# Patient Record
Sex: Male | Born: 1950 | Race: White | Hispanic: No | Marital: Married | Smoking: Never smoker
Health system: Southern US, Community
[De-identification: ages and names within clinical notes are randomized; demographics above are authoritative.]

## PROBLEM LIST (undated history)

## (undated) DIAGNOSIS — M779 Enthesopathy, unspecified: Secondary | ICD-10-CM

## (undated) DIAGNOSIS — C4491 Basal cell carcinoma of skin, unspecified: Secondary | ICD-10-CM

## (undated) DIAGNOSIS — F32A Depression, unspecified: Secondary | ICD-10-CM

## (undated) DIAGNOSIS — I493 Ventricular premature depolarization: Secondary | ICD-10-CM

## (undated) DIAGNOSIS — I10 Essential (primary) hypertension: Secondary | ICD-10-CM

## (undated) DIAGNOSIS — R011 Cardiac murmur, unspecified: Secondary | ICD-10-CM

## (undated) DIAGNOSIS — F329 Major depressive disorder, single episode, unspecified: Secondary | ICD-10-CM

## (undated) DIAGNOSIS — D649 Anemia, unspecified: Secondary | ICD-10-CM

## (undated) DIAGNOSIS — T7840XA Allergy, unspecified, initial encounter: Secondary | ICD-10-CM

## (undated) DIAGNOSIS — C801 Malignant (primary) neoplasm, unspecified: Secondary | ICD-10-CM

## (undated) HISTORY — DX: Depression, unspecified: F32.A

## (undated) HISTORY — DX: Ventricular premature depolarization: I49.3

## (undated) HISTORY — DX: Allergy, unspecified, initial encounter: T78.40XA

## (undated) HISTORY — DX: Malignant (primary) neoplasm, unspecified: C80.1

## (undated) HISTORY — DX: Enthesopathy, unspecified: M77.9

## (undated) HISTORY — PX: TONSILLECTOMY AND ADENOIDECTOMY: SHX28

## (undated) HISTORY — PX: CYSTOSCOPY: SUR368

## (undated) HISTORY — DX: Anemia, unspecified: D64.9

## (undated) HISTORY — DX: Major depressive disorder, single episode, unspecified: F32.9

## (undated) HISTORY — PX: EYE SURGERY: SHX253

## (undated) HISTORY — PX: HERNIA REPAIR: SHX51

## (undated) HISTORY — DX: Cardiac murmur, unspecified: R01.1

## (undated) HISTORY — DX: Basal cell carcinoma of skin, unspecified: C44.91

## (undated) HISTORY — DX: Essential (primary) hypertension: I10

---

## 1997-07-02 ENCOUNTER — Ambulatory Visit (HOSPITAL_COMMUNITY): Admission: RE | Admit: 1997-07-02 | Discharge: 1997-07-02 | Payer: Self-pay | Admitting: Family Medicine

## 1998-10-26 ENCOUNTER — Ambulatory Visit (HOSPITAL_COMMUNITY): Admission: RE | Admit: 1998-10-26 | Discharge: 1998-10-26 | Payer: Self-pay | Admitting: Gastroenterology

## 1998-12-10 ENCOUNTER — Encounter: Payer: Self-pay | Admitting: *Deleted

## 1998-12-10 ENCOUNTER — Encounter: Admission: RE | Admit: 1998-12-10 | Discharge: 1998-12-10 | Payer: Self-pay | Admitting: *Deleted

## 1998-12-17 ENCOUNTER — Encounter: Payer: Self-pay | Admitting: *Deleted

## 1998-12-21 ENCOUNTER — Encounter: Payer: Self-pay | Admitting: *Deleted

## 1998-12-21 ENCOUNTER — Ambulatory Visit (HOSPITAL_COMMUNITY): Admission: RE | Admit: 1998-12-21 | Discharge: 1998-12-21 | Payer: Self-pay | Admitting: *Deleted

## 2003-09-04 ENCOUNTER — Ambulatory Visit (HOSPITAL_COMMUNITY): Admission: RE | Admit: 2003-09-04 | Discharge: 2003-09-04 | Payer: Self-pay | Admitting: General Surgery

## 2010-10-13 ENCOUNTER — Ambulatory Visit (INDEPENDENT_AMBULATORY_CARE_PROVIDER_SITE_OTHER): Payer: Medicare HMO | Admitting: Ophthalmology

## 2010-10-13 DIAGNOSIS — H43819 Vitreous degeneration, unspecified eye: Secondary | ICD-10-CM

## 2010-10-13 DIAGNOSIS — H33309 Unspecified retinal break, unspecified eye: Secondary | ICD-10-CM

## 2010-10-13 DIAGNOSIS — H2510 Age-related nuclear cataract, unspecified eye: Secondary | ICD-10-CM

## 2010-10-31 ENCOUNTER — Ambulatory Visit (INDEPENDENT_AMBULATORY_CARE_PROVIDER_SITE_OTHER): Payer: Self-pay | Admitting: Ophthalmology

## 2011-10-13 ENCOUNTER — Encounter (INDEPENDENT_AMBULATORY_CARE_PROVIDER_SITE_OTHER): Payer: BC Managed Care – PPO | Admitting: Ophthalmology

## 2011-10-13 DIAGNOSIS — H43819 Vitreous degeneration, unspecified eye: Secondary | ICD-10-CM

## 2011-10-13 DIAGNOSIS — H3530 Unspecified macular degeneration: Secondary | ICD-10-CM

## 2011-10-13 DIAGNOSIS — H33309 Unspecified retinal break, unspecified eye: Secondary | ICD-10-CM

## 2011-10-13 DIAGNOSIS — H2510 Age-related nuclear cataract, unspecified eye: Secondary | ICD-10-CM

## 2011-11-03 ENCOUNTER — Encounter: Payer: Self-pay | Admitting: Family Medicine

## 2011-11-03 ENCOUNTER — Ambulatory Visit (INDEPENDENT_AMBULATORY_CARE_PROVIDER_SITE_OTHER): Payer: BC Managed Care – PPO | Admitting: Family Medicine

## 2011-11-03 VITALS — BP 138/88 | HR 62 | Temp 98.7°F | Resp 16 | Ht 72.0 in | Wt 243.0 lb

## 2011-11-03 DIAGNOSIS — R4586 Emotional lability: Secondary | ICD-10-CM

## 2011-11-03 DIAGNOSIS — R42 Dizziness and giddiness: Secondary | ICD-10-CM

## 2011-11-03 DIAGNOSIS — H9313 Tinnitus, bilateral: Secondary | ICD-10-CM

## 2011-11-03 DIAGNOSIS — H9190 Unspecified hearing loss, unspecified ear: Secondary | ICD-10-CM | POA: Insufficient documentation

## 2011-11-03 DIAGNOSIS — Z23 Encounter for immunization: Secondary | ICD-10-CM

## 2011-11-03 DIAGNOSIS — Z0000 Encounter for general adult medical examination without abnormal findings: Secondary | ICD-10-CM

## 2011-11-03 LAB — COMPREHENSIVE METABOLIC PANEL
ALT: 9 U/L (ref 0–53)
AST: 21 U/L (ref 0–37)
Albumin: 4.4 g/dL (ref 3.5–5.2)
Alkaline Phosphatase: 57 U/L (ref 39–117)
BUN: 16 mg/dL (ref 6–23)
CO2: 27 mEq/L (ref 19–32)
Calcium: 9.3 mg/dL (ref 8.4–10.5)
Chloride: 104 mEq/L (ref 96–112)
Creat: 0.84 mg/dL (ref 0.50–1.35)
Glucose, Bld: 91 mg/dL (ref 70–99)
Potassium: 4.2 mEq/L (ref 3.5–5.3)
Sodium: 140 mEq/L (ref 135–145)
Total Bilirubin: 1.5 mg/dL — ABNORMAL HIGH (ref 0.3–1.2)
Total Protein: 6.8 g/dL (ref 6.0–8.3)

## 2011-11-03 LAB — CBC WITH DIFFERENTIAL/PLATELET
Basophils Absolute: 0 10*3/uL (ref 0.0–0.1)
Basophils Relative: 0 % (ref 0–1)
Eosinophils Absolute: 0.1 10*3/uL (ref 0.0–0.7)
Eosinophils Relative: 2 % (ref 0–5)
HCT: 46.7 % (ref 39.0–52.0)
Hemoglobin: 16.7 g/dL (ref 13.0–17.0)
Lymphocytes Relative: 21 % (ref 12–46)
Lymphs Abs: 1.6 10*3/uL (ref 0.7–4.0)
MCH: 31.3 pg (ref 26.0–34.0)
MCHC: 35.8 g/dL (ref 30.0–36.0)
MCV: 87.5 fL (ref 78.0–100.0)
Monocytes Absolute: 0.6 10*3/uL (ref 0.1–1.0)
Monocytes Relative: 7 % (ref 3–12)
Neutro Abs: 5.3 10*3/uL (ref 1.7–7.7)
Neutrophils Relative %: 70 % (ref 43–77)
Platelets: 260 10*3/uL (ref 150–400)
RBC: 5.34 MIL/uL (ref 4.22–5.81)
RDW: 13.8 % (ref 11.5–15.5)
WBC: 7.6 10*3/uL (ref 4.0–10.5)

## 2011-11-03 LAB — LIPID PANEL
Cholesterol: 203 mg/dL — ABNORMAL HIGH (ref 0–200)
HDL: 53 mg/dL (ref >39)
LDL Cholesterol: 130 mg/dL — ABNORMAL HIGH (ref 0–99)
Total CHOL/HDL Ratio: 3.8 Ratio
Triglycerides: 99 mg/dL (ref <150)
VLDL: 20 mg/dL (ref 0–40)

## 2011-11-03 LAB — TSH: TSH: 1.65 u[IU]/mL (ref 0.350–4.500)

## 2011-11-03 LAB — HEPATITIS C ANTIBODY: HCV Ab: NEGATIVE

## 2011-11-03 LAB — PSA: PSA: 4.98 ng/mL — ABNORMAL HIGH (ref <=4.00)

## 2011-11-03 MED ORDER — HYDROCHLOROTHIAZIDE 12.5 MG PO TABS: 12.5000 mg | ORAL_TABLET | Freq: Every day | ORAL | Status: DC

## 2011-11-03 MED ORDER — BUPROPION HCL ER (XL) 150 MG PO TB24: 150.0000 mg | ORAL_TABLET | Freq: Every day | ORAL | Status: DC

## 2011-11-03 MED ORDER — FLUTICASONE PROPIONATE 50 MCG/ACT NA SUSP
2.0000 | Freq: Every day | NASAL | Status: DC
Start: 2011-11-03 — End: 2012-04-10

## 2011-11-03 NOTE — Progress Notes (Signed)
  Subjective:    Patient ID: Donald Norman, male    DOB: 26-Dec-1950, 61 y.o.   MRN: [id number]  HPI    Review of Systems  Constitutional: Positive for fatigue.  HENT: Positive for congestion and tinnitus.   Eyes: Negative.   Respiratory: Negative.   Cardiovascular: Negative.   Gastrointestinal: Negative.   Genitourinary: Negative.   Musculoskeletal: Negative.   Neurological: Positive for dizziness.  Hematological: Negative.   Psychiatric/Behavioral: Positive for decreased concentration.       Objective:   Physical Exam        Assessment & Plan:

## 2011-11-03 NOTE — Progress Notes (Signed)
Subjective:    Patient ID: Donald Norman, male    DOB: [birth year], 61 y.o.   MRN: [id number] Chief Complaint  Patient presents with  . Annual Exam    HPI Feeling faint a lot Decreased equilibrium Used zoloft prev but now noticing more trouble concentrating and getting down mood - now mother having health problems.  Past Medical History  Diagnosis Date  . Allergy   . Depression   . Heart murmur   . Hypertension   . Cancer   . Basal cell cancer   . Anemia   . PVC (premature ventricular contraction)    No current outpatient prescriptions on file prior to visit.   No current facility-administered medications on file prior to visit.   No Known Allergies Past Surgical History  Procedure Laterality Date  . Eye surgery  lasik and retina repair  . Tonsillectomy and adenoidectomy    . Cystoscopy    . Hernia repair     Family History  Problem Relation Age of Onset  . Heart disease Mother   . Hypertension Mother   . Diabetes Father   . Heart disease Father   . Hypertension Sister   . Anemia Sister   . Heart disease Maternal Grandmother   . Diabetes Paternal Grandmother    History   Social History  . Marital Status: Married    Spouse Name: N/A    Number of Children: N/A  . Years of Education: N/A   Social History Main Topics  . Smoking status: Never Smoker   . Smokeless tobacco: None  . Alcohol Use: Yes  . Drug Use: No  . Sexually Active: None   Other Topics Concern  . None   Social History Narrative  . None     Review of Systems  see note below for ROS    BP 138/88  Pulse 62  Temp(Src) 98.7 F (37.1 C) (Oral)  Resp 16  Ht 6' (1.829 m)  Wt 243 lb (110.224 kg)  BMI 32.95 kg/m2  SpO2 96% Objective:   Physical Exam  Constitutional: He is oriented to person, place, and time. He appears well-developed and well-nourished. No distress.  HENT:  Head: Normocephalic and atraumatic.  Right Ear: Tympanic membrane, external ear and ear canal normal.  Left  Ear: Tympanic membrane, external ear and ear canal normal.  Nose: Nose normal.  Mouth/Throat: Uvula is midline, oropharynx is clear and moist and mucous membranes are normal. No oropharyngeal exudate.  Eyes: Conjunctivae are normal. Right eye exhibits no discharge. Left eye exhibits no discharge. No scleral icterus.  Neck: Normal range of motion. Neck supple. No thyromegaly present.  Cardiovascular: Normal rate, regular rhythm, normal heart sounds and intact distal pulses.   Pulmonary/Chest: Effort normal and breath sounds normal. No respiratory distress.  Abdominal: Soft. Bowel sounds are normal. He exhibits no distension and no mass. There is no tenderness. There is no rebound and no guarding.  Musculoskeletal: He exhibits no edema.  Lymphadenopathy:    He has no cervical adenopathy.  Neurological: He is alert and oriented to person, place, and time. He has normal reflexes. No cranial nerve deficit. He exhibits normal muscle tone.  Skin: Skin is warm and dry. No rash noted. He is not diaphoretic. No erythema.  Psychiatric: He has a normal mood and affect. His behavior is normal.          Assessment & Plan:  Routine general medical examination at a health care facility - Plan: Lipid panel, PSA,  Tdap vaccine greater than or equal to 7yo IM, Hepatitis C antibody  Loss of equilibrium - Plan: CBC with Differential, Comprehensive metabolic panel, TSH  Tinnitus of both ears - Plan: try fluticasone (FLONASE) 50 MCG/ACT nasal spray  Elevated blood pressure - Plan: start hydrochlorothiazide (HYDRODIURIL) 12.5 MG tablet  Mood change - Plan: restart buPROPion (WELLBUTRIN XL) 150 MG 24 hr tablet  Hearing decreased - f/u w/ ENT  Meds ordered this encounter  Medications  . aspirin 81 MG tablet    Sig: Take 81 mg by mouth daily.  Marland Kitchen ibuprofen (ADVIL,MOTRIN) 200 MG tablet    Sig: Take 200 mg by mouth every 6 (six) hours as needed.  Marland Kitchen DISCONTD: etodolac (LODINE) 400 MG tablet    Sig:   .  hydrochlorothiazide (HYDRODIURIL) 12.5 MG tablet    Sig: Take 1 tablet (12.5 mg total) by mouth daily.    Dispense:  90 tablet    Refill:  1  . buPROPion (WELLBUTRIN XL) 150 MG 24 hr tablet    Sig: Take 1 tablet (150 mg total) by mouth daily.    Dispense:  90 tablet    Refill:  1  . fluticasone (FLONASE) 50 MCG/ACT nasal spray    Sig: Place 2 sprays into the nose daily.    Dispense:  16 g    Refill:  6

## 2012-01-10 ENCOUNTER — Encounter (INDEPENDENT_AMBULATORY_CARE_PROVIDER_SITE_OTHER): Payer: BC Managed Care – PPO | Admitting: Ophthalmology

## 2012-01-10 DIAGNOSIS — H33309 Unspecified retinal break, unspecified eye: Secondary | ICD-10-CM

## 2012-01-10 DIAGNOSIS — H43819 Vitreous degeneration, unspecified eye: Secondary | ICD-10-CM

## 2012-01-10 DIAGNOSIS — H4310 Vitreous hemorrhage, unspecified eye: Secondary | ICD-10-CM

## 2012-01-10 DIAGNOSIS — H2510 Age-related nuclear cataract, unspecified eye: Secondary | ICD-10-CM

## 2012-01-24 ENCOUNTER — Ambulatory Visit (INDEPENDENT_AMBULATORY_CARE_PROVIDER_SITE_OTHER): Payer: BC Managed Care – PPO | Admitting: Ophthalmology

## 2012-01-24 DIAGNOSIS — H33309 Unspecified retinal break, unspecified eye: Secondary | ICD-10-CM

## 2012-03-11 ENCOUNTER — Telehealth: Payer: Self-pay

## 2012-03-11 NOTE — Telephone Encounter (Signed)
Pt is needing to talk with someone about being referred to an ent office   Best number [phone]

## 2012-03-11 NOTE — Telephone Encounter (Signed)
Order put in for ENT Dr Clelia Croft advised.

## 2012-03-19 ENCOUNTER — Ambulatory Visit (INDEPENDENT_AMBULATORY_CARE_PROVIDER_SITE_OTHER): Payer: BC Managed Care – PPO | Admitting: Family Medicine

## 2012-03-19 VITALS — BP 148/98 | HR 72 | Temp 98.5°F | Resp 18 | Ht 73.0 in | Wt 253.0 lb

## 2012-03-19 DIAGNOSIS — R635 Abnormal weight gain: Secondary | ICD-10-CM

## 2012-03-19 DIAGNOSIS — R5383 Other fatigue: Secondary | ICD-10-CM

## 2012-03-19 DIAGNOSIS — R5381 Other malaise: Secondary | ICD-10-CM

## 2012-03-19 LAB — POCT URINALYSIS DIPSTICK
Glucose, UA: NEGATIVE
Ketones, UA: NEGATIVE
Spec Grav, UA: 1.02
Urobilinogen, UA: 0.2

## 2012-03-19 LAB — COMPREHENSIVE METABOLIC PANEL
ALT: 10 U/L (ref 0–53)
Albumin: 4.5 g/dL (ref 3.5–5.2)
Alkaline Phosphatase: 51 U/L (ref 39–117)
Glucose, Bld: 82 mg/dL (ref 70–99)
Potassium: 4.3 mEq/L (ref 3.5–5.3)
Sodium: 139 mEq/L (ref 135–145)
Total Protein: 6.9 g/dL (ref 6.0–8.3)

## 2012-03-19 LAB — POCT UA - MICROSCOPIC ONLY
Casts, Ur, LPF, POC: NEGATIVE
Crystals, Ur, HPF, POC: NEGATIVE
Yeast, UA: NEGATIVE

## 2012-03-19 LAB — POCT CBC
Granulocyte percent: 65 %G (ref 37–80)
Hemoglobin: 16.8 g/dL (ref 14.1–18.1)
MCH, POC: 31 pg (ref 27–31.2)
MPV: 8.9 fL (ref 0–99.8)
POC Granulocyte: 4.2 (ref 2–6.9)
POC MID %: 7.5 %M (ref 0–12)
RBC: 5.42 M/uL (ref 4.69–6.13)
WBC: 6.4 10*3/uL (ref 4.6–10.2)

## 2012-03-19 LAB — TSH: TSH: 2.703 u[IU]/mL (ref 0.350–4.500)

## 2012-03-19 NOTE — Progress Notes (Addendum)
[facility] [address] [place] [address] [phone]  Date:  03/19/2012   Name:  Donald Norman.   DOB:  January 17, 1950   MRN:  [id number]  PCP:  No primary provider on file.    Chief Complaint: Dizziness   History of Present Illness:  Donald Norman. is a 62 y.o. very pleasant male patient who presents with the following:  Was here in November of 2013 with complaint of feeling faint.  He had fairly extensive labs at that time.    He states he has general weakness, "I feel like I'm going to black out."  This has gone on for about 10 days.  However, overall he has felt bad for about one month with fatigue and cold symptoms.   He saw ENT last week- he was worried about dysequilibrium.  His evaluation was ok except for mild hearing loss. He also has tinnitus which is long- standing.    He has sinus congestion, feels that his ears are "wet" on the inside, and notes some PND which causes an occasional cough.   No fever- he has checked his temperature several times.   No syncope, no vertigo.    No diet changes or acute stressors that he can think of.   He actually feels worse when he sits for a long time.    He did see his urologist for probably prostatitis in November and was treated, his PSA improved.  Otherwise he had extensive labs in [place] which were normal.    He stopped taking his HCTZ a few days ago to see if it was the cause of his symptoms-  however this did not seem to help.   He had started this in November due to slightly elevated BP and possible Menieres- when he first started HCTZ he felt faint but then got better  No heart palpitations, no CP.  He notes that he gets tired more easily if he walks up a flight of stairs.   He had exercised regularly until recently.  He has gained about 15 lbs over the last several months, due to eating poorly and not exercising for the last month or so.   No recent travel, no hemoptysis.  No calf swelling or  pain He had noted headaches- however he stopped his flonase about 2 weeks ago and this went away.    He was told he had a heart murmur as a child, but this turned out to be benign.  He did have a stress test about 10 years ago- this looked ok except for PVCs  Patient Active Problem List  Diagnosis  . Mood change  . Hearing decreased  . Elevated blood pressure    Past Medical History  Diagnosis Date  . Allergy   . Depression   . Heart murmur   . Hypertension   . Cancer   . Basal cell cancer     Past Surgical History  Procedure Laterality Date  . Eye surgery  lasik and retina repair  . Tonsillectomy and adenoidectomy    . Cystoscopy      History  Substance Use Topics  . Smoking status: Never Smoker   . Smokeless tobacco: Not on file  . Alcohol Use: Yes    Family History  Problem Relation Age of Onset  . Heart disease Mother   . Hypertension Mother   . Diabetes Father   . Heart disease Father   . Hypertension Sister   .  Anemia Sister   . Heart disease Maternal Grandmother   . Diabetes Paternal Grandmother     No Known Allergies  Medication list has been reviewed and updated.  Current Outpatient Prescriptions on File Prior to Visit  Medication Sig Dispense Refill  . aspirin 81 MG tablet Take 81 mg by mouth daily.      Marland Kitchen etodolac (LODINE) 400 MG tablet       . hydrochlorothiazide (HYDRODIURIL) 12.5 MG tablet Take 1 tablet (12.5 mg total) by mouth daily.  90 tablet  1  . ibuprofen (ADVIL,MOTRIN) 200 MG tablet Take 200 mg by mouth every 6 (six) hours as needed.      Marland Kitchen buPROPion (WELLBUTRIN XL) 150 MG 24 hr tablet Take 1 tablet (150 mg total) by mouth daily.  90 tablet  1  . fluticasone (FLONASE) 50 MCG/ACT nasal spray Place 2 sprays into the nose daily.  16 g  6   No current facility-administered medications on file prior to visit.    Review of Systems:  As per HPI- otherwise negative.   Physical Examination: Filed Vitals:   03/19/12 1014  BP: 150/90   Pulse: 85  Temp: 98.5 F (36.9 C)  Resp: 18   Filed Vitals:   03/19/12 1014  Height: 6\' 1"  (1.854 m)  Weight: 253 lb (114.76 kg)   Body mass index is 33.39 kg/(m^2). Ideal Body Weight: Weight in (lb) to have BMI = 25: 189.1  GEN: WDWN, NAD, Non-toxic, A & O x 3, obese, looks well HEENT: Atraumatic, Normocephalic. Neck supple. No masses, No LAD.  Bilateral TM wnl, oropharynx normal.  PEERL,EOMI.   Ears and Nose: No external deformity. CV: RRR, No M/G/R. No JVD. No thrill. No extra heart sounds. PULM: CTA B, no wheezes, crackles, rhonchi. No retractions. No resp. distress. No accessory muscle use. ABD: S, NT, ND, +BS. No rebound. No HSM. EXTR: No c/c/e NEURO Normal gait. Normal strength and movement all extremities PSYCH: Normally interactive. Conversant. Not depressed or anxious appearing.  Calm demeanor.   Orthostatics performed- negative  EKG:  SR with a few PVCs.  He has a small Q wave in I which does not appear significant  Results for orders placed in visit on 03/19/12  POCT UA - MICROSCOPIC ONLY      Result Value Range   WBC, Ur, HPF, POC 1-2     RBC, urine, microscopic 2-4     Bacteria, U Microscopic trace     Mucus, UA neg     Epithelial cells, urine per micros 1-3     Crystals, Ur, HPF, POC neg     Casts, Ur, LPF, POC neg     Yeast, UA neg    POCT URINALYSIS DIPSTICK      Result Value Range   Color, UA yellow     Clarity, UA clear     Glucose, UA neg     Bilirubin, UA neg     Ketones, UA neg     Spec Grav, UA 1.020     Blood, UA mod     pH, UA 6.0     Protein, UA neg     Urobilinogen, UA 0.2     Nitrite, UA neg     Leukocytes, UA Negative    GLUCOSE, POCT (MANUAL RESULT ENTRY)      Result Value Range   POC Glucose 73  70 - 99 mg/dl  POCT CBC      Result Value Range   WBC 6.4  4.6 - 10.2 K/uL   Lymph, poc 1.8  0.6 - 3.4   POC LYMPH PERCENT 27.5  10 - 50 %L   MID (cbc) 0.5  0 - 0.9   POC MID % 7.5  0 - 12 %M   POC Granulocyte 4.2  2 - 6.9    Granulocyte percent 65.0  37 - 80 %G   RBC 5.42  4.69 - 6.13 M/uL   Hemoglobin 16.8  14.1 - 18.1 g/dL   HCT, POC 40.9  81.1 - 53.7 %   MCV 91.7  80 - 97 fL   MCH, POC 31.0  27 - 31.2 pg   MCHC 33.8  31.8 - 35.4 g/dL   RDW, POC 91.4     Platelet Count, POC 295  142 - 424 K/uL   MPV 8.9  0 - 99.8 fL    Assessment and Plan: Fatigue - Plan: TSH, POCT UA - Microscopic Only, POCT urinalysis dipstick, EKG 12-Lead, Comprehensive metabolic panel, POCT glucose (manual entry), POCT CBC, Urine culture, Ambulatory referral to Cardiology, CANCELED: CBC, CANCELED: POCT CBC  Weight gain - Plan: TSH  Microhematuria - Plan: Urine culture  Non- specific fatigue for 10 days or more.  Will refer to cardiology for evaluation- he may need a stress test.  Otherwise await further lab results.    Will have his electrolytes/ renal function back tomorrow. In the meantime his BP is a bit high, so he may start back on his low dose HCTZ.  If any changes or worsening in the meantime he is to let us know right away or seek care at the ED if necessary  Microhematuria- await urine culture. His urologist is Dr. Irene Pap.  He has never been a smoker.     Signed Abbe Amsterdam, MD  03/20/12- called to let him know his TSH and CMP were normal- await urine culture and will send letter once this is back.  He is feeling better today, but still plan to proceed with cardiology evaluation  03/21/12- called with negative urine culture.  See phone note from 3/20:Called to let him know- his urine culture was negative. However, he noted some burning after urination this am, and a mild "ache" in his bladder. He has been diagnosed with interstitial cystitis in the past.  He states he used to use keflex and cipro for this condition  He was last on cipro for prostatits about 2 months ago- used for about one month. He would like to try taking keflex, and this is reasonable. Will rx a week of keflex for him, and send his most recent labs  to his urologist. He will let me know if he has not heard about his cardiology appt in the next day or so- will send a message to referrals    03/22/12- called his urologist and asked his nurse Herbert Seta to look out for records, will fax to Dr. Irene Pap  Will send a copy of his labs and notes.

## 2012-03-19 NOTE — Patient Instructions (Addendum)
Go ahead and restart your HCTZ.  I will be in touch with the rest of your labs in the next day or so.  I am going to refer you to cardiology for evaluation.  If you have any problems in the meantime please let me know/ come back in or seek care at the ER if necessary

## 2012-03-20 LAB — URINE CULTURE
Colony Count: NO GROWTH
Organism ID, Bacteria: NO GROWTH

## 2012-03-21 ENCOUNTER — Encounter: Payer: Self-pay | Admitting: Family Medicine

## 2012-03-21 ENCOUNTER — Telehealth: Payer: Self-pay

## 2012-03-21 DIAGNOSIS — R300 Dysuria: Secondary | ICD-10-CM

## 2012-03-21 MED ORDER — CEPHALEXIN 500 MG PO CAPS: 500.0000 mg | ORAL_CAPSULE | Freq: Two times a day (BID) | ORAL | Status: DC

## 2012-03-21 NOTE — Telephone Encounter (Signed)
Patient saw Dr. Patsy Lager  He has pain in Bladder, urethra, and thinks he has a UTI.   He is waiting for the urine results for his OV.  He had cystitis in the past.  [phone] on file      HE SAID it was [phone]

## 2012-03-21 NOTE — Telephone Encounter (Signed)
Urine culture shows no growth, please advise.

## 2012-03-21 NOTE — Telephone Encounter (Signed)
Called to let him know- his urine culture was negative.  However, he noted some burning after urination this am, and a mild "ache" in his bladder.  He has been diagnosed with interstitial cystitis in the past.   He states he used to use keflex and cipro for this condition  He was last on cipro for prostatits about 2 months ago- used for about one month.  He would like to try taking keflex, and this is reasonable. Will rx a week of keflex for him, and send his most recent labs to his urologist.  He will let me know if he has not heard about his cardiology appt in the next day or so- will send a message to referrals

## 2012-03-28 ENCOUNTER — Telehealth: Payer: Self-pay

## 2012-03-28 ENCOUNTER — Telehealth: Payer: Self-pay | Admitting: Family Medicine

## 2012-03-28 NOTE — Telephone Encounter (Signed)
Called and LMOM_ asked him to please call with an update as to if the keflex helped with his symptoms. Also, it looks like his cardiology appt is scheduled for 4/9- this is great, but if he still feels weak or strange please come and see me for a recheck in the meantime, as this is still a couple of weeks away.

## 2012-03-28 NOTE — Telephone Encounter (Signed)
Pt is returning dr copland call to let her know how he is feeling and he states he thought he was getting better but today he feels tired again  Patient is planning on coming in to see dr copland next week if no better

## 2012-03-28 NOTE — Telephone Encounter (Signed)
Called patient. Advised to rest. Fatigue is expected during recovery period. This should improve gradually. Donald Norman

## 2012-03-28 NOTE — Telephone Encounter (Signed)
Called and spoke with him- yesterday he felt great, but today he just feels tired, but no other particular symptoms noted.  No orthopnea, no SOB, no CP.  I do feel that a cardiac evaluation is pertinent, but want to make sure we are not missing anything else. Asked him to come and see me tomorrow if he continues to feel bad tomorrow and he agreed

## 2012-03-28 NOTE — Telephone Encounter (Signed)
Called and confirmed that Donald Norman's records have been received at [facility].

## 2012-03-29 ENCOUNTER — Ambulatory Visit: Payer: BC Managed Care – PPO

## 2012-03-29 ENCOUNTER — Ambulatory Visit (INDEPENDENT_AMBULATORY_CARE_PROVIDER_SITE_OTHER): Payer: BC Managed Care – PPO | Admitting: Family Medicine

## 2012-03-29 VITALS — BP 145/90 | HR 66 | Temp 98.4°F | Resp 16 | Ht 72.0 in | Wt 252.6 lb

## 2012-03-29 DIAGNOSIS — R5381 Other malaise: Secondary | ICD-10-CM

## 2012-03-29 DIAGNOSIS — R5383 Other fatigue: Secondary | ICD-10-CM

## 2012-03-29 DIAGNOSIS — R635 Abnormal weight gain: Secondary | ICD-10-CM

## 2012-03-29 NOTE — Progress Notes (Signed)
[facility] [address], [place] [address] [phone]  Date:  03/29/2012   Name:  Donald Norman.   DOB:  1950-10-28   MRN:  [id number]  PCP:  No primary provider on file.    Chief Complaint: Follow-up   History of Present Illness:  Donald Norman. is a 62 y.o. very pleasant male patient who presents with the following:  Seen here on 3/18 with a complaint of fatigue and pre- syncope.  He is here today for a recheck.  We had talked on the phone yesterday and he reported that he had felt better, then worse again, so he came in for evaluation.  He does have a cardiology appt on 04/10/12.   In the morning he feels tired, "just washed out."  Will feel better during the day when he is active, but worse again if he sits for a long period.  He does not note orthopnea.  No peripheral edema.   He also notes a cyst on his left arm.  No other new findings   He has not noted any CP.  He does not have any SOB.   Occasional cough, no hemoptysis.   He has never been a smoker.    Patient Active Problem List  Diagnosis  . Mood change  . Hearing decreased  . Elevated blood pressure    Past Medical History  Diagnosis Date  . Allergy   . Depression   . Heart murmur   . Hypertension   . Cancer   . Basal cell cancer   . Anemia   . PVC (premature ventricular contraction)     Past Surgical History  Procedure Laterality Date  . Eye surgery  lasik and retina repair  . Tonsillectomy and adenoidectomy    . Cystoscopy    . Hernia repair      History  Substance Use Topics  . Smoking status: Never Smoker   . Smokeless tobacco: Not on file  . Alcohol Use: Yes    Family History  Problem Relation Age of Onset  . Heart disease Mother   . Hypertension Mother   . Diabetes Father   . Heart disease Father   . Hypertension Sister   . Anemia Sister   . Heart disease Maternal Grandmother   . Diabetes Paternal Grandmother     No Known Allergies  Medication list  has been reviewed and updated.  Current Outpatient Prescriptions on File Prior to Visit  Medication Sig Dispense Refill  . aspirin 81 MG tablet Take 81 mg by mouth daily.      . cephALEXin (KEFLEX) 500 MG capsule Take 1 capsule (500 mg total) by mouth 2 (two) times daily.  20 capsule  0  . hydrochlorothiazide (HYDRODIURIL) 12.5 MG tablet Take 1 tablet (12.5 mg total) by mouth daily.  90 tablet  1  . ibuprofen (ADVIL,MOTRIN) 200 MG tablet Take 200 mg by mouth every 6 (six) hours as needed.      Marland Kitchen buPROPion (WELLBUTRIN XL) 150 MG 24 hr tablet Take 1 tablet (150 mg total) by mouth daily.  90 tablet  1  . etodolac (LODINE) 400 MG tablet       . fluticasone (FLONASE) 50 MCG/ACT nasal spray Place 2 sprays into the nose daily.  16 g  6   No current facility-administered medications on file prior to visit.    Review of Systems:  As per HPI- otherwise negative.   Physical Examination: Filed Vitals:  03/29/12 1230  BP: 144/104  Pulse: 88  Temp: 98.4 F (36.9 C)  Resp: 16   Filed Vitals:   03/29/12 1230  Height: 6' (1.829 m)  Weight: 252 lb 9.6 oz (114.579 kg)   Body mass index is 34.25 kg/(m^2). Ideal Body Weight: Weight in (lb) to have BMI = 25: 183.9  GEN: WDWN, NAD, Non-toxic, A & O x 3, overweight HEENT: Atraumatic, Normocephalic. Neck supple. No masses, No LAD.  Bilateral TM wnl, oropharynx normal.  PEERL,EOMI.   Ears and Nose: No external deformity. CV: RRR, No M/G/R. No JVD. No thrill. No extra heart sounds. PULM: CTA B, no wheezes, crackles, rhonchi. No retractions. No resp. distress. No accessory muscle use. ABD: S, NT, ND EXTR: No c/c/e NEURO Normal gait.  PSYCH: Normally interactive. Conversant. Not depressed or anxious appearing.  Calm demeanor.   EKG: compared to EKG from earlier this month, no significant change.  Normal EKG.    [facility] reading (PRIMARY) by  Dr. Patsy Lager. Normal, no cardiac enlargement CHEST - 2 VIEW  Comparison: None.  Findings:  Cardiomediastinal silhouette appears normal. No acute pulmonary disease is noted. Bony thorax is intact.  IMPRESSION: No acute cardiopulmonary abnormality seen.   Assessment and Plan: Other malaise and fatigue - Plan: EKG 12-Lead, DG Chest 2 View, Testosterone, D-dimer, quantitative  Weight gain - Plan: Brain natriuretic peptide, Testosterone  Canceled D dimer as he has normal EKG, normal CP, normal VS.   [name] notes persistent fatigue for about one month.  Will check a BNP and testosterone as above.  Assuming these are normal, believe he will be safe to await his cardiology appt.  If he develops any CP or other symptoms in the meantime he is to let me know.   Signed Abbe Amsterdam, MD

## 2012-03-30 LAB — BRAIN NATRIURETIC PEPTIDE: Brain Natriuretic Peptide: 12.1 pg/mL (ref 0.0–100.0)

## 2012-03-31 ENCOUNTER — Telehealth: Payer: Self-pay | Admitting: Family Medicine

## 2012-03-31 NOTE — Telephone Encounter (Signed)
Called and discussed with him. His BNP is normal, no sign of CHF.  Testosterone is low- this may be why he is feeling fatigued.  He is interested in starting T replacement therapy- however explained that we need to be sure his heart is healthy prior to starting testosterone.  He will see cards on 4/9, and we will follow- up after this visit.  Of note he has had a borderline PSA in the past, will need to discuss T therapy in conjunction with his urologist.

## 2012-04-08 ENCOUNTER — Encounter: Payer: Self-pay | Admitting: Cardiology

## 2012-04-08 ENCOUNTER — Encounter: Payer: Self-pay | Admitting: *Deleted

## 2012-04-08 DIAGNOSIS — T7840XA Allergy, unspecified, initial encounter: Secondary | ICD-10-CM | POA: Insufficient documentation

## 2012-04-08 DIAGNOSIS — R011 Cardiac murmur, unspecified: Secondary | ICD-10-CM | POA: Insufficient documentation

## 2012-04-08 DIAGNOSIS — D649 Anemia, unspecified: Secondary | ICD-10-CM | POA: Insufficient documentation

## 2012-04-08 DIAGNOSIS — I493 Ventricular premature depolarization: Secondary | ICD-10-CM | POA: Insufficient documentation

## 2012-04-08 DIAGNOSIS — C801 Malignant (primary) neoplasm, unspecified: Secondary | ICD-10-CM | POA: Insufficient documentation

## 2012-04-08 DIAGNOSIS — I10 Essential (primary) hypertension: Secondary | ICD-10-CM | POA: Insufficient documentation

## 2012-04-08 DIAGNOSIS — F329 Major depressive disorder, single episode, unspecified: Secondary | ICD-10-CM | POA: Insufficient documentation

## 2012-04-10 ENCOUNTER — Ambulatory Visit (INDEPENDENT_AMBULATORY_CARE_PROVIDER_SITE_OTHER): Payer: BC Managed Care – PPO | Admitting: Cardiovascular Disease

## 2012-04-10 VITALS — BP 144/91 | HR 74 | Ht 73.0 in | Wt 251.0 lb

## 2012-04-10 DIAGNOSIS — R5381 Other malaise: Secondary | ICD-10-CM

## 2012-04-10 DIAGNOSIS — I493 Ventricular premature depolarization: Secondary | ICD-10-CM

## 2012-04-10 DIAGNOSIS — R5383 Other fatigue: Secondary | ICD-10-CM

## 2012-04-10 DIAGNOSIS — I10 Essential (primary) hypertension: Secondary | ICD-10-CM

## 2012-04-10 DIAGNOSIS — I4949 Other premature depolarization: Secondary | ICD-10-CM

## 2012-04-10 NOTE — Assessment & Plan Note (Signed)
Benign asymptomatic  Given mulitple infections and fatigue will order echo to r/o SBE and make sure EF is normal

## 2012-04-10 NOTE — Progress Notes (Signed)
Patient ID: Donald Hinch., male   DOB: 09/28/1950, 62 y.o.   MRN: [id number] 62 yo with fatigue since November.  No previios heart problems.  Has had tinnitis, interstitial cystitis and URI's all winter and been on antibiotics. Sometimes feels better after getting up and doing some activity.  Has had mild headache. No edema, palpitations chest pain or cardiac symptoms No syncope.  TSH 2.7 on 3/18  Do not see that he has been tested for addisons or myesthenia   ROS: Denies fever, malais, weight loss, blurry vision, decreased visual acuity, cough, sputum, SOB, hemoptysis, pleuritic pain, palpitaitons, heartburn, abdominal pain, melena, lower extremity edema, claudication, or rash.  All other systems reviewed and negative   General: Affect appropriate Healthy:  appears stated age HEENT: normal Neck supple with no adenopathy JVP normal no bruits no thyromegaly Lungs clear with no wheezing and good diaphragmatic motion Heart:  S1/S2 no murmur,rub, gallop or click PMI normal Abdomen: benighn, BS positve, no tenderness, no AAA no bruit.  No HSM or HJR Distal pulses intact with no bruits No edema Neuro non-focal Skin warm and dry No muscular weakness  Medications Current Outpatient Prescriptions  Medication Sig Dispense Refill  . aspirin 81 MG tablet Take 81 mg by mouth daily.      . hydrochlorothiazide (HYDRODIURIL) 12.5 MG tablet Take 1 tablet (12.5 mg total) by mouth daily.  90 tablet  1  . ibuprofen (ADVIL,MOTRIN) 200 MG tablet Take 200 mg by mouth every 6 (six) hours as needed.      . Misc Natural Products (PROSTATE SUPPORT PO) Take by mouth.      . Multiple Vitamin (MULTIVITAMIN WITH MINERALS) TABS Take 1 tablet by mouth daily.      . Potassium Gluconate 550 MG TABS Take by mouth.       No current facility-administered medications for this visit.    Allergies Review of patient's allergies indicates no known allergies.  Family History: Family History  Problem Relation Age of  Onset  . Heart disease Mother   . Hypertension Mother   . Diabetes Father   . Heart disease Father   . Hypertension Sister   . Anemia Sister   . Heart disease Maternal Grandmother   . Diabetes Paternal Grandmother     Social History: History   Social History  . Marital Status: Married    Spouse Name: N/A    Number of Children: N/A  . Years of Education: N/A   Occupational History  . Not on file.   Social History Main Topics  . Smoking status: Never Smoker   . Smokeless tobacco: Not on file  . Alcohol Use: Yes  . Drug Use: No  . Sexually Active: Not on file   Other Topics Concern  . Not on file   Social History Narrative  . No narrative on file    Electrocardiogram:  NSR rate 78  Normal ECG  Assessment and Plan

## 2012-04-10 NOTE — Assessment & Plan Note (Signed)
Well controlled.  Continue current medications and low sodium Dash type diet.    

## 2012-04-10 NOTE — Patient Instructions (Signed)

## 2012-04-11 ENCOUNTER — Ambulatory Visit (HOSPITAL_COMMUNITY): Payer: BC Managed Care – PPO | Attending: Cardiovascular Disease | Admitting: Radiology

## 2012-04-11 DIAGNOSIS — I10 Essential (primary) hypertension: Secondary | ICD-10-CM | POA: Insufficient documentation

## 2012-04-11 DIAGNOSIS — R5381 Other malaise: Secondary | ICD-10-CM | POA: Insufficient documentation

## 2012-04-11 DIAGNOSIS — R011 Cardiac murmur, unspecified: Secondary | ICD-10-CM | POA: Insufficient documentation

## 2012-04-11 DIAGNOSIS — R5383 Other fatigue: Secondary | ICD-10-CM

## 2012-04-11 DIAGNOSIS — E669 Obesity, unspecified: Secondary | ICD-10-CM | POA: Insufficient documentation

## 2012-04-11 DIAGNOSIS — I4949 Other premature depolarization: Secondary | ICD-10-CM | POA: Insufficient documentation

## 2012-04-11 NOTE — Progress Notes (Signed)
Echocardiogram performed.  

## 2012-04-15 ENCOUNTER — Telehealth: Payer: Self-pay | Admitting: Cardiovascular Disease

## 2012-04-15 NOTE — Telephone Encounter (Signed)
New problem    Returning call back to nurse.   

## 2012-04-15 NOTE — Telephone Encounter (Signed)
PT AWARE OF ECHO RESULTS./CY 

## 2012-04-17 ENCOUNTER — Telehealth: Payer: Self-pay

## 2012-04-17 NOTE — Telephone Encounter (Signed)
PATIENT STATES THAT HE HAD A CARDIOLOGY REFERRAL AND THE RESULTS HAVE COME BACK. HE WANTS TO KNOW THE NEXT STEPS. PLEASE CALL AT [phone].

## 2012-04-18 NOTE — Telephone Encounter (Signed)
Dr Eden Emms has advised patient of echo results, but do not see plan, have you gotten correspondence regarding this?

## 2012-04-18 NOTE — Telephone Encounter (Signed)
Reviewed his echo- looks like he has some diastolic dysfunction but this may not be significant.  Called [place] but could not get through.  Will send epic message to Dr. Eden Emms, and will follow- up with Renae Fickle once I hear back.

## 2012-04-22 ENCOUNTER — Telehealth: Payer: Self-pay | Admitting: Family Medicine

## 2012-04-22 ENCOUNTER — Encounter: Payer: Self-pay | Admitting: Family Medicine

## 2012-04-22 NOTE — Telephone Encounter (Signed)
Message copied by Pearline Cables on Mon Apr 22, 2012 11:55 AM ------      Message from: Wendall Stade      Created: Sun Apr 21, 2012  2:42 PM       Ok to Rx low T no further cardiac w.u needed      ----- Message -----         From: Pearline Cables, MD         Sent: 04/18/2012   1:30 PM           To: Wendall Stade, MD            Dear Dr. Eden Emms,            Thanks for seeing my patient Lavone Nian.  He called me to ask if anything else was needed from a cardiac standpoint.  Per my read his echo showed diastolic dysfunction, but I am not sure if this is significant.  If no further cardiac evaluation is needed I plan to try treating his low testosterone in hopes of relieving his fatigue.              Warm regards,            Jessica Copland       ------

## 2012-04-22 NOTE — Telephone Encounter (Signed)
Ok to do testosterone replacement.  However, noted pt has seen Dr. Vernie Ammons in the past for elevated PSA.  Called his urologist to discuss his care.   Spoke with his nurse and passed along information regarding his testosterone level. ?ok to start T replacement (had an elevated PSA in the past but this apparently resolved).

## 2012-04-24 ENCOUNTER — Telehealth: Payer: Self-pay | Admitting: Family Medicine

## 2012-04-24 NOTE — Telephone Encounter (Signed)
Discussed with his wife.  He saw his urologist today.  They are following up and plan to see him again next week

## 2012-05-29 ENCOUNTER — Ambulatory Visit (INDEPENDENT_AMBULATORY_CARE_PROVIDER_SITE_OTHER): Payer: BC Managed Care – PPO | Admitting: Ophthalmology

## 2012-06-05 ENCOUNTER — Other Ambulatory Visit: Payer: Self-pay | Admitting: Family Medicine

## 2012-06-11 ENCOUNTER — Ambulatory Visit (INDEPENDENT_AMBULATORY_CARE_PROVIDER_SITE_OTHER): Payer: BC Managed Care – PPO | Admitting: Ophthalmology

## 2012-06-11 DIAGNOSIS — H43819 Vitreous degeneration, unspecified eye: Secondary | ICD-10-CM

## 2012-06-11 DIAGNOSIS — I10 Essential (primary) hypertension: Secondary | ICD-10-CM

## 2012-06-11 DIAGNOSIS — H35039 Hypertensive retinopathy, unspecified eye: Secondary | ICD-10-CM

## 2012-06-11 DIAGNOSIS — H2510 Age-related nuclear cataract, unspecified eye: Secondary | ICD-10-CM

## 2012-06-11 DIAGNOSIS — H33309 Unspecified retinal break, unspecified eye: Secondary | ICD-10-CM

## 2012-07-13 ENCOUNTER — Ambulatory Visit: Payer: BC Managed Care – PPO

## 2012-07-13 ENCOUNTER — Ambulatory Visit (INDEPENDENT_AMBULATORY_CARE_PROVIDER_SITE_OTHER): Payer: BC Managed Care – PPO | Admitting: Family Medicine

## 2012-07-13 DIAGNOSIS — S82892A Other fracture of left lower leg, initial encounter for closed fracture: Secondary | ICD-10-CM

## 2012-07-13 DIAGNOSIS — M25562 Pain in left knee: Secondary | ICD-10-CM

## 2012-07-13 DIAGNOSIS — M25569 Pain in unspecified knee: Secondary | ICD-10-CM

## 2012-07-13 DIAGNOSIS — R079 Chest pain, unspecified: Secondary | ICD-10-CM
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
## 2012-07-16 ENCOUNTER — Encounter: Payer: Self-pay | Admitting: *Deleted

## 2012-08-02 DIAGNOSIS — Z0271 Encounter for disability determination: Secondary | ICD-10-CM

## 2012-10-14 ENCOUNTER — Ambulatory Visit (INDEPENDENT_AMBULATORY_CARE_PROVIDER_SITE_OTHER): Payer: BC Managed Care – PPO | Admitting: Ophthalmology

## 2013-06-11 ENCOUNTER — Ambulatory Visit (INDEPENDENT_AMBULATORY_CARE_PROVIDER_SITE_OTHER): Payer: BC Managed Care – PPO | Admitting: Ophthalmology

## 2013-06-11 DIAGNOSIS — H33309 Unspecified retinal break, unspecified eye: Secondary | ICD-10-CM

## 2013-06-11 DIAGNOSIS — H2510 Age-related nuclear cataract, unspecified eye: Secondary | ICD-10-CM

## 2013-06-11 DIAGNOSIS — H43819 Vitreous degeneration, unspecified eye: Secondary | ICD-10-CM

## 2013-06-11 DIAGNOSIS — I10 Essential (primary) hypertension: Secondary | ICD-10-CM

## 2013-06-11 DIAGNOSIS — H35039 Hypertensive retinopathy, unspecified eye: Secondary | ICD-10-CM

## 2013-06-11 DIAGNOSIS — H3530 Unspecified macular degeneration: Secondary | ICD-10-CM

## 2013-11-21 ENCOUNTER — Encounter: Payer: Self-pay | Admitting: Family Medicine

## 2013-11-21 ENCOUNTER — Ambulatory Visit (INDEPENDENT_AMBULATORY_CARE_PROVIDER_SITE_OTHER): Payer: BC Managed Care – PPO | Admitting: Family Medicine

## 2013-11-21 VITALS — BP 156/102 | HR 66 | Temp 98.5°F | Resp 16 | Ht 72.0 in | Wt 220.0 lb

## 2013-11-21 DIAGNOSIS — R7989 Other specified abnormal findings of blood chemistry: Secondary | ICD-10-CM

## 2013-11-21 DIAGNOSIS — E7800 Pure hypercholesterolemia, unspecified: Secondary | ICD-10-CM

## 2013-11-21 DIAGNOSIS — E291 Testicular hypofunction: Secondary | ICD-10-CM

## 2013-11-21 DIAGNOSIS — Z0000 Encounter for general adult medical examination without abnormal findings: Secondary | ICD-10-CM

## 2013-11-21 DIAGNOSIS — I10 Essential (primary) hypertension: Secondary | ICD-10-CM

## 2013-11-21 DIAGNOSIS — H9319 Tinnitus, unspecified ear: Secondary | ICD-10-CM

## 2013-11-21 DIAGNOSIS — H6980 Other specified disorders of Eustachian tube, unspecified ear: Secondary | ICD-10-CM

## 2013-11-21 LAB — CBC
HEMATOCRIT: 52.4 % — AB (ref 39.0–52.0)
Hemoglobin: 18.2 g/dL — ABNORMAL HIGH (ref 13.0–17.0)
MCH: 31.2 pg (ref 26.0–34.0)
MCHC: 34.7 g/dL (ref 30.0–36.0)
MCV: 89.9 fL (ref 78.0–100.0)
MPV: 9.9 fL (ref 9.4–12.4)
PLATELETS: 218 10*3/uL (ref 150–400)
RBC: 5.83 MIL/uL — ABNORMAL HIGH (ref 4.22–5.81)
RDW: 13.6 % (ref 11.5–15.5)
WBC: 5.7 10*3/uL (ref 4.0–10.5)

## 2013-11-21 LAB — COMPLETE METABOLIC PANEL WITH GFR
ALK PHOS: 48 U/L (ref 39–117)
ALT: 8 U/L (ref 0–53)
AST: 21 U/L (ref 0–37)
Albumin: 4.4 g/dL (ref 3.5–5.2)
BILIRUBIN TOTAL: 1.9 mg/dL — AB (ref 0.2–1.2)
BUN: 17 mg/dL (ref 6–23)
CO2: 27 mEq/L (ref 19–32)
Calcium: 9.3 mg/dL (ref 8.4–10.5)
Chloride: 99 mEq/L (ref 96–112)
Creat: 0.84 mg/dL (ref 0.50–1.35)
GFR, Est African American: >89 mL/min
GLUCOSE: 90 mg/dL (ref 70–99)
Potassium: 4.2 mEq/L (ref 3.5–5.3)
SODIUM: 137 meq/L (ref 135–145)
TOTAL PROTEIN: 6.7 g/dL (ref 6.0–8.3)

## 2013-11-21 LAB — LIPID PANEL
CHOL/HDL RATIO: 3.5 ratio
CHOLESTEROL: 195 mg/dL (ref 0–200)
HDL: 55 mg/dL (ref >39)
LDL Cholesterol: 125 mg/dL — ABNORMAL HIGH (ref 0–99)
Triglycerides: 76 mg/dL (ref <150)
VLDL: 15 mg/dL (ref 0–40)

## 2013-11-21 MED ORDER — LOSARTAN POTASSIUM 50 MG PO TABS: 50.0000 mg | ORAL_TABLET | Freq: Every day | ORAL | Status: DC

## 2013-11-21 NOTE — Progress Notes (Signed)
MRN: [id number]  Subjective:   [name] is a 63 y.o. male presenting for annual physical exam.  Medical care team includes:  PCP: Donald Kroner, MD, last visit 07/2013 Vision: Dr. Tempie Norman, last seen 07/2013, signs of macular degeneration, follow up next year. Dental: Dr. Evelene Norman, last seen 08/2013, no new significant findings. Specialists:   Colonoscopy: Dr. Daisey Norman, no significant findings, last seen 10 years ago, patient recently called and reminded of need for f/u.  Urology: Dr. Karsten Norman, low testosterone  No Cardiologist.  [name] has Mood change; Hearing decreased; Elevated blood pressure; Depression; Heart murmur; Hypertension; and PVC (premature ventricular contraction) on his problem list.  Concerns: Tinnitus - previously (~4 years ago) referred to Dr. Simeon Norman (ENT) for hearing loss, vertigo and tinnitus, Rx'ed antivert which helped with dizziness but ringing remains. Hearing loss is unchanged, not worsened. Tinnitus is intermittent, not positional. Denies itchy, watery eyes, congestion, vertigo, dizziness, allergies, fevers.  HTN: on 25mg  losartan, non-compliant with medications, started them back up 3-4 days ago since he knew he was coming in for a visit. Diet has been bad, "eating like a pig", not exercising. Not smoking, drinking 1-2 beers a week. ROS below.  Immunizations: Flu vaccine 10/2013, last tetanus 11/03/2011, Shingles 2014    Prior to Admission medications   Medication Sig Start Date End Date Taking? Authorizing Provider  ANDROGEL PUMP 20.25 MG/ACT (1.62%) GEL  10/29/13  Yes Historical Provider, MD  losartan (COZAAR) 25 MG tablet Take 25 mg by mouth daily.   Yes Historical Provider, MD  sertraline (ZOLOFT) 25 MG tablet  10/29/13  Yes Historical Provider, MD  aspirin 81 MG tablet Take 81 mg by mouth daily.    Historical Provider, MD  hydrochlorothiazide (HYDRODIURIL) 12.5 MG tablet Take 1 tablet (12.5 mg total) by mouth daily. 11/03/11   Shawnee Knapp, MD  hydrochlorothiazide (MICROZIDE) 12.5 MG capsule Take 1 capsule (12.5 mg total) by mouth every morning. PATIENT NEEDS OFFICE VISIT FOR ADDITIONAL REFILLS 06/05/12   Mancel Bale, PA-C  ibuprofen (ADVIL,MOTRIN) 200 MG tablet Take 200 mg by mouth every 6 (six) hours as needed.    Historical Provider, MD  Misc Natural Products (PROSTATE SUPPORT PO) Take by mouth.    Historical Provider, MD  Multiple Vitamin (MULTIVITAMIN WITH MINERALS) TABS Take 1 tablet by mouth daily.    Historical Provider, MD  Potassium Gluconate 550 MG TABS Take by mouth.    Historical Provider, MD  testosterone (ANDROGEL) 50 MG/5GM GEL Place 5 g onto the skin daily.    Historical Provider, MD  vitamin B-12 (CYANOCOBALAMIN) 1000 MCG tablet Take 1,000 mcg by mouth daily.    Historical Provider, MD    No Known Allergies   Past Medical History  Diagnosis Date  . Allergy   . Depression   . Heart murmur   . Hypertension   . Cancer   . Basal cell cancer   . Anemia   . PVC (premature ventricular contraction)     Past Surgical History  Procedure Laterality Date  . Eye surgery  lasik and retina repair  . Tonsillectomy and adenoidectomy    . Cystoscopy    . Hernia repair      Review of Systems  Constitutional: Negative for fever and chills.  HENT: Positive for hearing loss (as in HPI) and tinnitus (as in HPI). Negative for congestion, ear discharge, ear pain and sore throat.   Eyes: Negative for double vision and discharge.  Respiratory: Negative  for cough, shortness of breath and wheezing.   Cardiovascular: Negative for chest pain, palpitations and leg swelling.  Gastrointestinal: Negative for nausea, vomiting, abdominal pain, diarrhea, constipation and blood in stool.  Genitourinary: Negative for dysuria, hematuria and flank pain.  Musculoskeletal: Negative for myalgias, back pain and joint pain.  Skin: Negative for rash.  Neurological: Negative for dizziness, tingling and headaches.    Psychiatric/Behavioral: Negative for depression.     Objective:   PHYSICAL EXAM BP 156/102 mmHg  Pulse 66  Temp(Src) 98.5 F (36.9 C) (Oral)  Resp 16  Ht 6' (1.829 m)  Wt 220 lb (99.791 kg)  BMI 29.83 kg/m2  SpO2 97%  BP Readings from Last 3 Encounters:  11/21/13 156/102  07/13/12 144/102  04/10/12 144/91   Physical Exam  Constitutional: He is oriented to person, place, and time and well-developed, well-nourished, and in no distress. No distress.  HENT:  Head: Normocephalic and atraumatic.  Nose: Nose normal.  Mouth/Throat: Oropharynx is clear and moist. No oropharyngeal exudate.  Flat TM's bilaterally, otherwise unremarkable.  Eyes: Conjunctivae are normal. Pupils are equal, round, and reactive to light. Right eye exhibits no discharge. Left eye exhibits no discharge. No scleral icterus.  Neck: Normal range of motion. Neck supple. No thyromegaly present.  Cardiovascular: Normal rate, regular rhythm, normal heart sounds and intact distal pulses.  Exam reveals no gallop and no friction rub.   No murmur heard. Pulmonary/Chest: Effort normal and breath sounds normal. No stridor. No respiratory distress. He has no wheezes. He has no rales. He exhibits no tenderness.  Abdominal: Soft. Bowel sounds are normal. He exhibits no distension and no mass. There is no tenderness.  Genitourinary:  Patient declined, follow up with Urology scheduled in 12/2013.  Musculoskeletal: Normal range of motion. He exhibits no edema or tenderness.  Lymphadenopathy:    He has no cervical adenopathy.  Neurological: He is alert and oriented to person, place, and time. He has normal reflexes.  Skin: Skin is warm and dry. No rash noted. He is not diaphoretic. No erythema.  Psychiatric: Affect normal.   Assessment and Plan :   Discussed healthy lifestyle, diet, exercise, preventative care, vaccinations, and addressed patient's concerns. Plan for follow up in 4 weeks for HTN recheck. Otherwise, plan for  specific conditions below.  1. PE (physical exam), routine - Return as needed for your annual physical exam, otherwise continue f/u with PCP - CBC - COMPLETE METABOLIC PANEL WITH GFR - Lipid panel  2. Essential hypertension - Uncontrolled due to noncompliance - ASCVD risk 28.8%, explained risks for heart disease, stroke, patient understands and agrees to better compliance - Patient unwilling to try 100mg  dose increase, agreed to 50mg , bp checks, stated that he would try to do better with diet and exercise - follow up as above  - losartan (COZAAR) 50 MG tablet; Take 1 tablet (50 mg total) by mouth daily.  Dispense: 30 tablet; Refill: 1  3. Tinnitus, unspecified laterality 4. Eustachian tube dysfunction, unspecified laterality - Tinnitus possibly d/t ETD, advised a trial of Flonase and Zyrtec OTC, return to clinic if symptoms worsen, fail to resolve or as needed  5. Elevated cholesterol - labs today, consider statin if elevated  6. Low testosterone - Explained risks of testosterone use in setting of HTN, advised to continue f/u with Urologist and revisit need for testosterone gel.  7. Colon cancer screening - Advised to follow through with his appointment for colonoscopy. Plans on scheduling this soon.   Jaynee Eagles, PA-C  [facility] [phone] 11/21/2013 11:04 AM

## 2013-11-21 NOTE — Assessment & Plan Note (Signed)
Resolved

## 2013-11-21 NOTE — Patient Instructions (Signed)

## 2013-11-29 ENCOUNTER — Encounter: Payer: Self-pay | Admitting: Urgent Care

## 2013-11-30 NOTE — Progress Notes (Signed)
Reviewed documentation and agree w/ assessment and plan. Rhonda Linan, MD MPH 

## 2013-12-19 ENCOUNTER — Encounter: Payer: Self-pay | Admitting: Family Medicine

## 2013-12-19 ENCOUNTER — Ambulatory Visit (INDEPENDENT_AMBULATORY_CARE_PROVIDER_SITE_OTHER): Payer: BC Managed Care – PPO | Admitting: Family Medicine

## 2013-12-19 VITALS — BP 124/80 | HR 67 | Temp 98.7°F | Resp 16 | Ht 72.0 in | Wt 225.0 lb

## 2013-12-19 DIAGNOSIS — E785 Hyperlipidemia, unspecified: Secondary | ICD-10-CM

## 2013-12-19 DIAGNOSIS — I10 Essential (primary) hypertension: Secondary | ICD-10-CM

## 2013-12-19 DIAGNOSIS — H9319 Tinnitus, unspecified ear: Secondary | ICD-10-CM

## 2013-12-19 DIAGNOSIS — H6980 Other specified disorders of Eustachian tube, unspecified ear: Secondary | ICD-10-CM

## 2013-12-19 MED ORDER — MOMETASONE FUROATE 50 MCG/ACT NA SUSP: 2.0000 | Freq: Every day | NASAL | Status: DC

## 2013-12-19 MED ORDER — LOSARTAN POTASSIUM 25 MG PO TABS: 25.0000 mg | ORAL_TABLET | Freq: Every day | ORAL | Status: DC

## 2013-12-19 NOTE — Progress Notes (Signed)
Subjective:    Patient ID: Donald Pacer., male    DOB: 06/14/1950, 63 y.o.   MRN: [id number] This chart was scribed for Delman Cheadle, MD by Marti Sleigh, Medical Scribe. This patient was seen in Room 26 and the patient's care was started a 2:47 PM.  Chief Complaint  Patient presents with  . Follow-up  . Blood pressure  . Cholestrol    HPI  Past Medical History  Diagnosis Date  . Allergy   . Depression   . Heart murmur   . Hypertension   . Cancer   . Basal cell cancer   . Anemia   . PVC (premature ventricular contraction)    No Known Allergies   Current Outpatient Prescriptions on File Prior to Visit  Medication Sig Dispense Refill  . ANDROGEL PUMP 20.25 MG/ACT (1.62%) GEL   4  . losartan (COZAAR) 50 MG tablet Take 1 tablet (50 mg total) by mouth daily. 30 tablet 1  . sertraline (ZOLOFT) 25 MG tablet   4  . testosterone (ANDROGEL) 50 MG/5GM GEL Place 5 g onto the skin daily.    . Misc Natural Products (PROSTATE SUPPORT PO) Take by mouth.     No current facility-administered medications on file prior to visit.    HPI Comments: Saw Donald [facility]. is a 63 y.o. male with a hx of HTN, PVC, low testosterone who presents to [facility] reporting for a HTN follow up. Pt reports intermittent weakness and jitteryness that he associates with his HTN medication. Pt denies dizziness or lightheadedness with standing. Pt states he has been having abnormal calf muscle pain. Pt states he stopped taking his zyrtec and Flonase due to mouth pain and burning in throat side affects. Pt states his tinnitus is at baseline, or perhaps very slightly improved. Pt states he has been checking his BP, pt states it has been around 120/75.   Donald Norman has been very noncompliant with his losartan medication which was originally prescribed at 25mg . Pt would take medication 3 days prior to appointment. Dose was increased to 50mg . Pt was going to try to comply with medical instruction. ASCVD risk was 30%. Pt was having  tinnitus and was started on Flonase and zyrtec as this was associated with eustachian tube dysfunction. Pt's LDL was 125 last moth. With a non-HDL chol of 140. His hematocrit, and hemaglobin were elevated. Pt has no hx of smoking. Pt was advised to return to clinic for recheck, but did not return.   Review of Systems  HENT: Positive for congestion.   Respiratory: Negative for shortness of breath.   Cardiovascular: Negative for chest pain.  Musculoskeletal: Positive for myalgias.  Neurological: Negative for dizziness and light-headedness.       Objective:  BP 124/80 mmHg  Pulse 67  Temp(Src) 98.7 F (37.1 C) (Oral)  Resp 16  Ht 6' (1.829 m)  Wt 225 lb (102.059 kg)  BMI 30.51 kg/m2  SpO2 95%  Physical Exam  Constitutional: He is oriented to person, place, and time. He appears well-developed and well-nourished.  HENT:  Head: Normocephalic and atraumatic.  Right Ear: Tympanic membrane is injected (Mildly).  Left Ear: Tympanic membrane is injected (Mildly).  Nasal mucosal edema.  Eyes: Pupils are equal, round, and reactive to light.  Neck: Neck supple. No thyromegaly present.  Cardiovascular: Normal rate, regular rhythm, S1 normal, S2 normal and normal heart sounds.   No murmur heard. Pulmonary/Chest: Effort normal and breath sounds normal. No respiratory distress.  Lymphadenopathy:  He has no cervical adenopathy.  Neurological: He is alert and oriented to person, place, and time.  Skin: Skin is warm and dry.  Psychiatric: He has a normal mood and affect. His behavior is normal.  Nursing note and vitals reviewed.      Assessment & Plan:  Hyperlipidemia  Tinnitus, unspecified laterality  Eustachian tube dysfunction, unspecified laterality - had burning w/ flonase but pharmacist reports that nasonex has the least amount of artificial additives so pt will hopefully tolerate better.  Essential hypertension - Plan: losartan (COZAAR) 25 MG tablet - became orthostatic on  losartan 50 - pt suspects that elev BP at last visit was due to his non-compliance with losartan 25 rather than lack of effect so will decrease back dose back to 25 and pt plans to cont increased compliance.  Meds ordered this encounter  Medications  . losartan (COZAAR) 25 MG tablet    Sig: Take 1 tablet (25 mg total) by mouth daily.    Dispense:  90 tablet    Refill:  3  . mometasone (NASONEX) 50 MCG/ACT nasal spray    Sig: Place 2 sprays into the nose daily.    Dispense:  17 g    Refill:  12    I personally performed the services described in this documentation, which was scribed in my presence. The recorded information has been reviewed and considered, and addended by me as needed.  Delman Cheadle, MD MPH

## 2014-01-09 ENCOUNTER — Encounter: Payer: Self-pay | Admitting: Family Medicine

## 2014-01-09 DIAGNOSIS — E291 Testicular hypofunction: Secondary | ICD-10-CM | POA: Insufficient documentation

## 2014-06-03 ENCOUNTER — Ambulatory Visit (INDEPENDENT_AMBULATORY_CARE_PROVIDER_SITE_OTHER): Payer: BLUE CROSS/BLUE SHIELD | Admitting: Ophthalmology

## 2014-06-03 DIAGNOSIS — H43813 Vitreous degeneration, bilateral: Secondary | ICD-10-CM

## 2014-06-03 DIAGNOSIS — H35033 Hypertensive retinopathy, bilateral: Secondary | ICD-10-CM

## 2014-06-03 DIAGNOSIS — I10 Essential (primary) hypertension: Secondary | ICD-10-CM

## 2014-06-03 DIAGNOSIS — H33303 Unspecified retinal break, bilateral: Secondary | ICD-10-CM | POA: Diagnosis not present

## 2014-06-03 DIAGNOSIS — H3531 Nonexudative age-related macular degeneration: Secondary | ICD-10-CM | POA: Diagnosis not present

## 2014-06-03 DIAGNOSIS — H2513 Age-related nuclear cataract, bilateral: Secondary | ICD-10-CM | POA: Diagnosis not present

## 2014-06-04 LAB — TESTOSTERONE, TOTAL AND FREE DIRECT MEASURE: Testosterone: 474

## 2014-06-09 DIAGNOSIS — N419 Inflammatory disease of prostate, unspecified: Secondary | ICD-10-CM | POA: Insufficient documentation

## 2014-06-09 DIAGNOSIS — N529 Male erectile dysfunction, unspecified: Secondary | ICD-10-CM | POA: Insufficient documentation

## 2014-06-09 DIAGNOSIS — N3010 Interstitial cystitis (chronic) without hematuria: Secondary | ICD-10-CM | POA: Insufficient documentation

## 2014-06-09 LAB — PSA: PSA: 3.66

## 2014-06-15 ENCOUNTER — Ambulatory Visit (INDEPENDENT_AMBULATORY_CARE_PROVIDER_SITE_OTHER): Payer: BC Managed Care – PPO | Admitting: Ophthalmology

## 2014-09-17 ENCOUNTER — Encounter (HOSPITAL_COMMUNITY): Payer: Self-pay | Admitting: *Deleted

## 2014-09-17 DIAGNOSIS — E291 Testicular hypofunction: Secondary | ICD-10-CM

## 2014-09-17 DIAGNOSIS — R972 Elevated prostate specific antigen [PSA]: Secondary | ICD-10-CM

## 2014-09-17 DIAGNOSIS — R3129 Other microscopic hematuria: Secondary | ICD-10-CM

## 2014-09-17 DIAGNOSIS — N3010 Interstitial cystitis (chronic) without hematuria: Secondary | ICD-10-CM

## 2014-09-17 NOTE — Assessment & Plan Note (Signed)
[facility] Kathie Rhodes, MD 06/09/14  Serum testosterone 239; began testosterone replacement, noted improvement in energy levels; energy level further improved with B12 injections; marked improvement in his libido.  Androgel 1.62% - 2 pumps/day

## 2014-09-27 ENCOUNTER — Ambulatory Visit (INDEPENDENT_AMBULATORY_CARE_PROVIDER_SITE_OTHER): Payer: BLUE CROSS/BLUE SHIELD | Admitting: Emergency Medicine

## 2014-09-27 VITALS — BP 144/88 | HR 82 | Temp 98.9°F | Resp 16 | Ht 72.0 in | Wt 250.6 lb

## 2014-09-27 DIAGNOSIS — H6991 Unspecified Eustachian tube disorder, right ear: Secondary | ICD-10-CM

## 2014-09-27 DIAGNOSIS — H6500 Acute serous otitis media, unspecified ear: Secondary | ICD-10-CM | POA: Diagnosis not present

## 2014-09-27 DIAGNOSIS — H6981 Other specified disorders of Eustachian tube, right ear: Secondary | ICD-10-CM

## 2014-09-27 IMAGING — CR DG CHEST 2V
2 series · 2 of 2 positions shown · non-contrast
Comparison: None.

CLINICAL DATA: Shortness of breath.

CHEST - 2 VIEW

[PA]
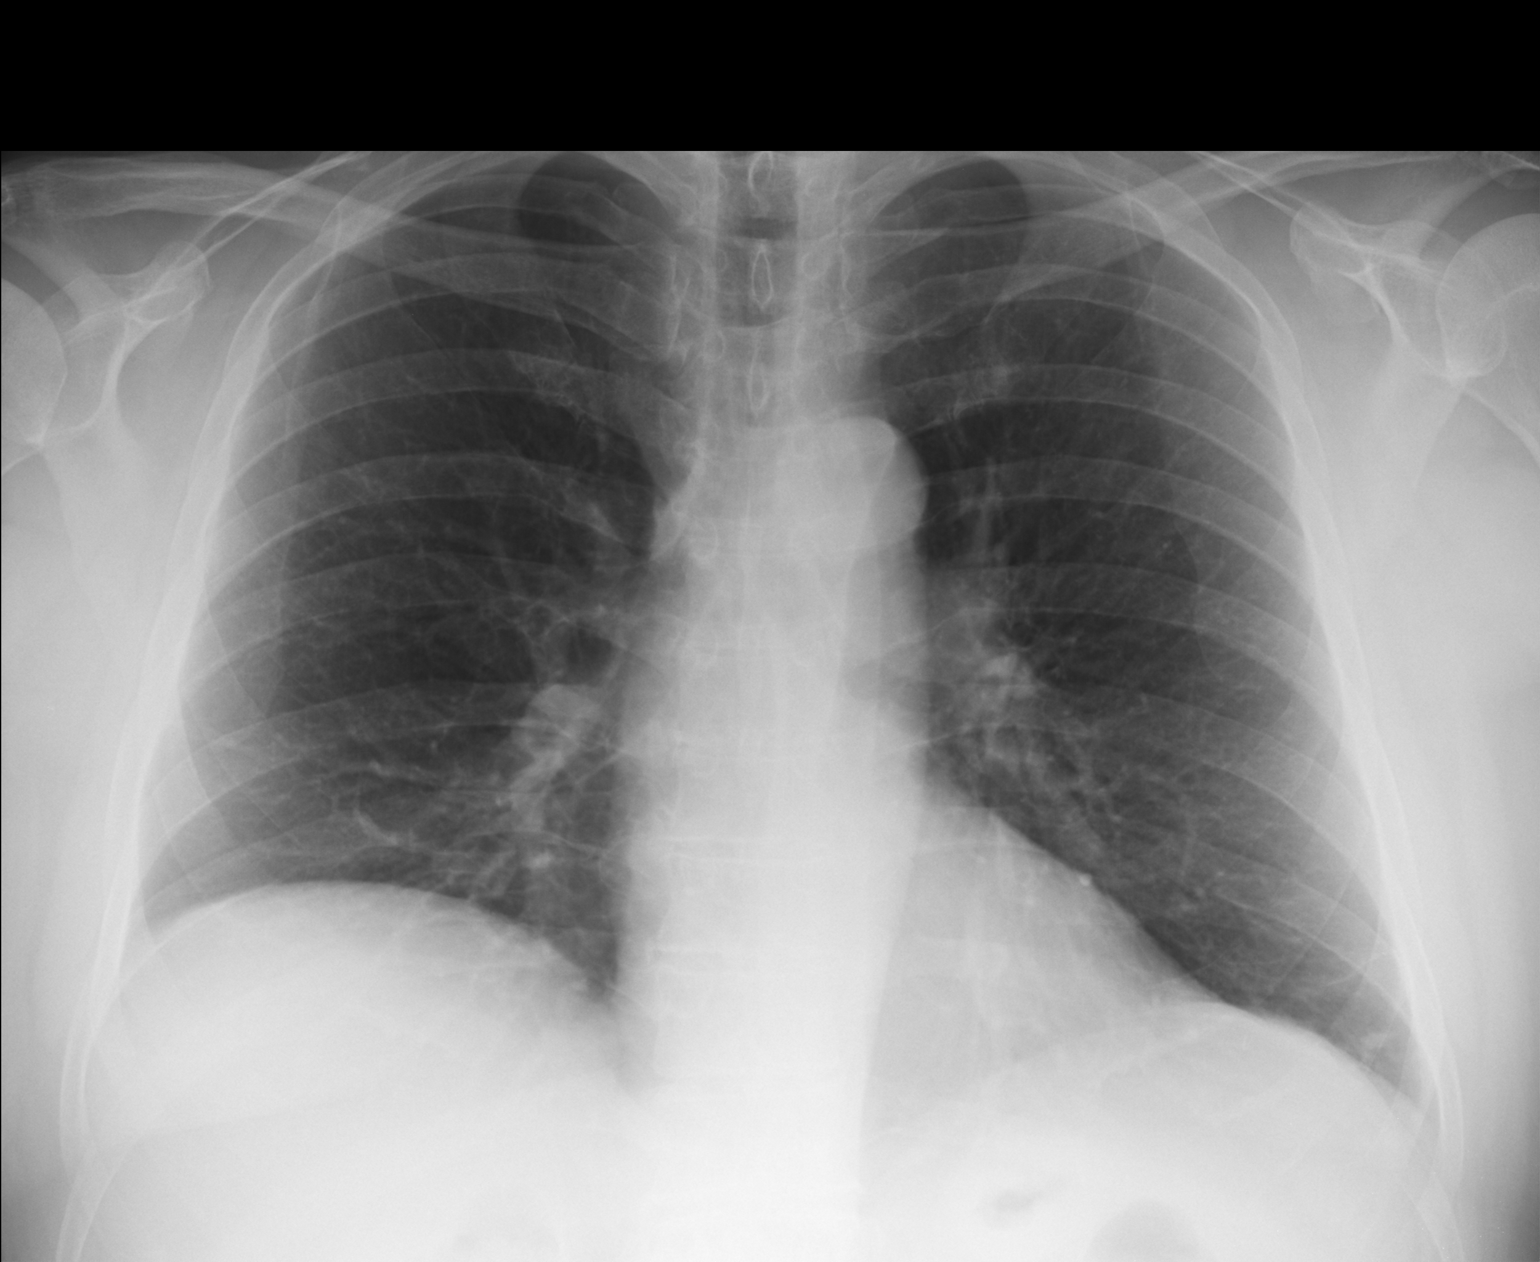

[lateral]
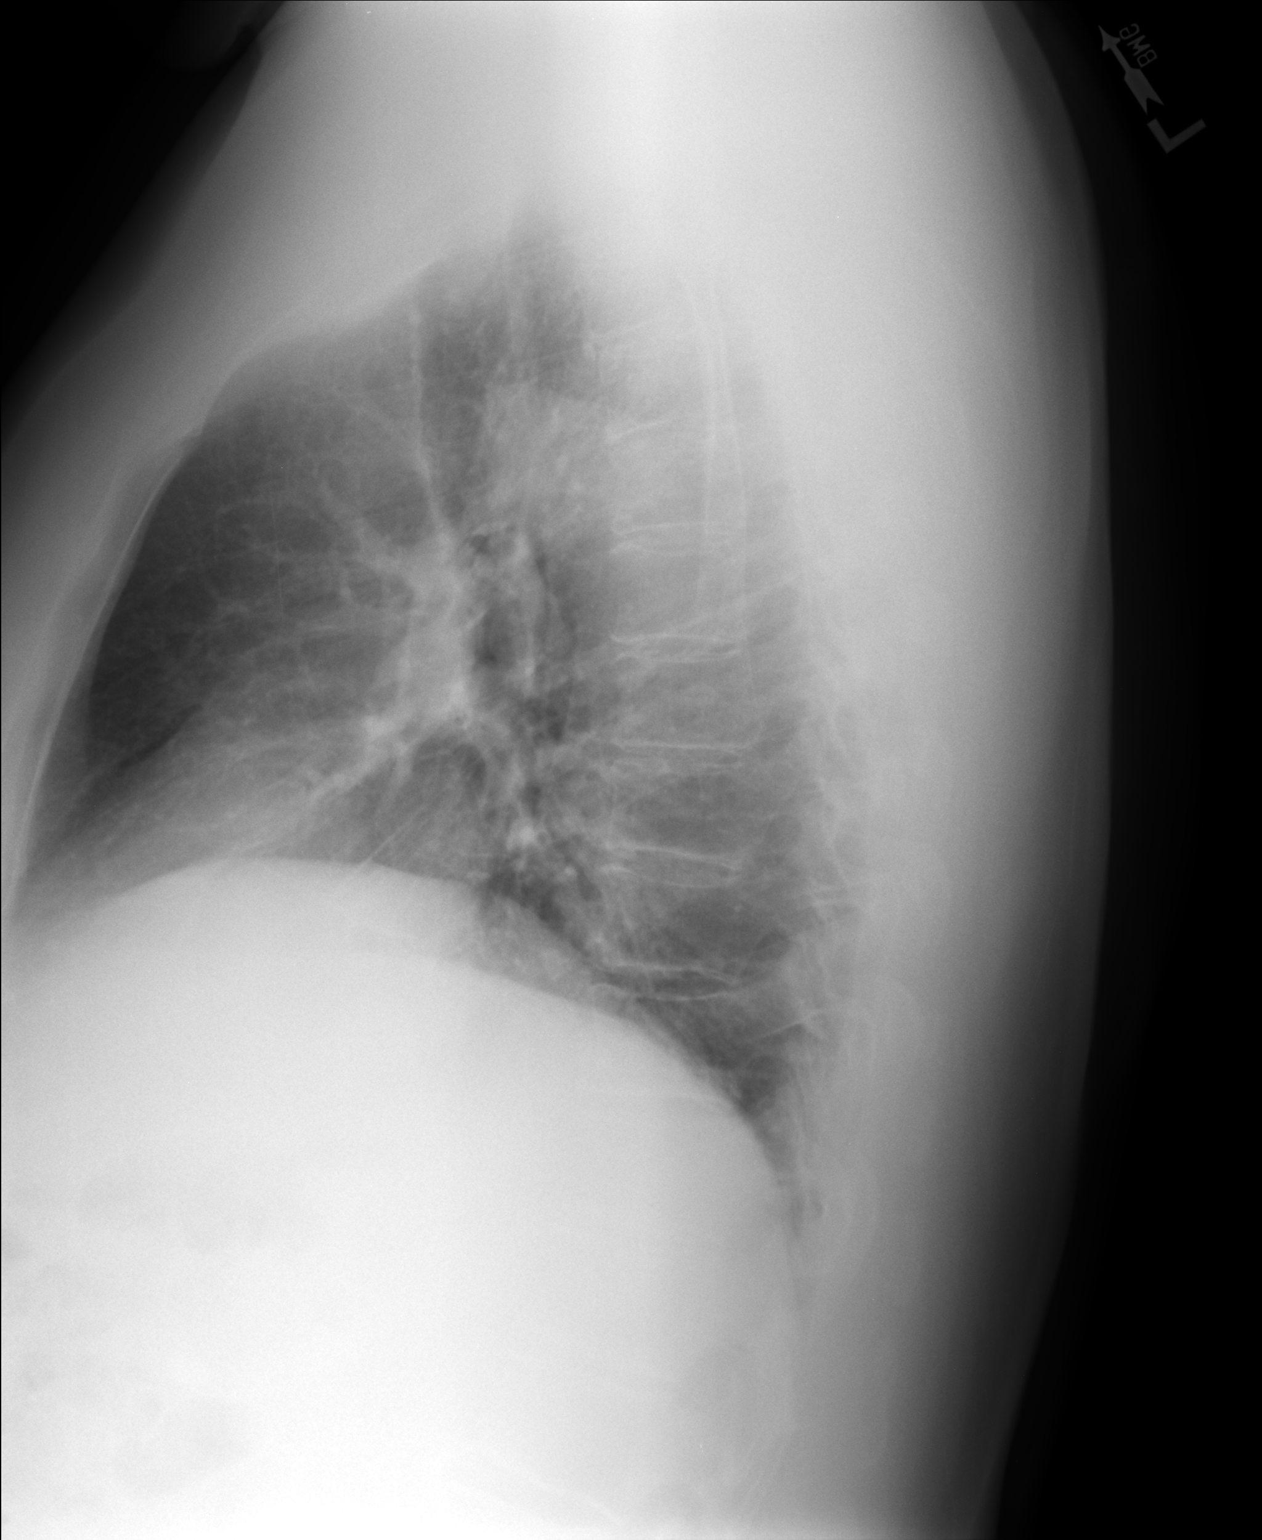

[2 of 2 positions shown; findings below may reference images not displayed]

FINDINGS: Cardiomediastinal silhouette appears normal.  No acute
pulmonary disease is noted.  Bony thorax is intact.
IMPRESSION: No acute cardiopulmonary abnormality seen.

## 2014-09-27 MED ORDER — AZELASTINE HCL 0.1 % NA SOLN: 2.0000 | Freq: Two times a day (BID) | NASAL | Status: DC

## 2014-09-27 MED ORDER — AMOXICILLIN-POT CLAVULANATE 875-125 MG PO TABS: 1.0000 | ORAL_TABLET | Freq: Two times a day (BID) | ORAL | Status: DC

## 2014-09-27 NOTE — Progress Notes (Signed)
This chart was scribed for Donald Jordan, MD by Donald [facility], medical scribe at [facility].The patient was seen in exam room 09 and the patient's care was started at 8:27 AM.  Chief Complaint:  Chief Complaint  Patient presents with  . Ear Pain    right ear, x 3 days, feels clogged   HPI: Donald Norman. is a 64 y.o. male who reports to [place] [facility] today complaining of bilateral ear pain right worse than left, for the past three days. Ears feel clogged. Some post nasal drip as well. No fever, colored nasal discharge, or sore throat. Dr. Drema Norman is treating his right wrist from [facility]. A programmer.  Past Medical History  Diagnosis Date  . Allergy   . Depression   . Heart murmur   . Hypertension   . Cancer   . Basal cell cancer   . Anemia   . PVC (premature ventricular contraction)   . Tendonitis    Past Surgical History  Procedure Laterality Date  . Eye surgery  lasik and retina repair  . Tonsillectomy and adenoidectomy    . Cystoscopy    . Hernia repair     Social History   Social History  . Marital Status: Married    Spouse Name: N/A  . Number of Children: N/A  . Years of Education: N/A   Social History Main Topics  . Smoking status: Never Smoker   . Smokeless tobacco: None  . Alcohol Use: 1.0 oz/week    2 drink(s) per week  . Drug Use: No  . Sexual Activity:    Partners: Female   Other Topics Concern  . None   Social History Narrative   Family History  Problem Relation Age of Onset  . Heart disease Mother   . Hypertension Mother   . Diabetes Father   . Heart disease Father   . Hypertension Sister   . Anemia Sister   . Heart disease Maternal Grandmother   . Diabetes Paternal Grandmother    No Known Allergies Prior to Admission medications   Medication Sig Start Date End Date Taking? Authorizing Provider  ANDROGEL PUMP 20.25 MG/ACT (1.62%) GEL  10/29/13  Yes Historical Provider, MD  diclofenac (VOLTAREN) 50 MG EC  tablet Take 65 mg by mouth 2 (two) times daily.   Yes Historical Provider, MD  losartan (COZAAR) 25 MG tablet Take 1 tablet (25 mg total) by mouth daily. 12/19/13  Yes Donald Knapp, MD  Nutritional Supplements (CORTICOSTEROIDS SUPPORT PO) Take by mouth.   Yes Historical Provider, MD  pseudoephedrine (SUDAFED) 30 MG tablet Take 30 mg by mouth every 4 (four) hours as needed for congestion.   Yes Historical Provider, MD  sertraline (ZOLOFT) 25 MG tablet  10/29/13  Yes Historical Provider, MD  Misc Natural Products (PROSTATE SUPPORT PO) Take by mouth.    Historical Provider, MD  mometasone (NASONEX) 50 MCG/ACT nasal spray Place 2 sprays into the nose daily. Patient not taking: Reported on 09/27/2014 12/19/13   Donald Knapp, MD  testosterone (ANDROGEL) 50 MG/5GM GEL Place 5 g onto the skin daily.    Historical Provider, MD   ROS: The patient denies fevers, chills, night sweats, unintentional weight loss, chest pain, palpitations, wheezing, dyspnea on exertion, nausea, vomiting, abdominal pain, dysuria, hematuria, melena, numbness, weakness, or tingling.  All other systems have been reviewed and were otherwise negative with the exception of those mentioned in the HPI and as above.  PHYSICAL EXAM: Filed Vitals:   09/27/14 0820  BP: 144/88  Pulse: 82  Temp: 98.9 F (37.2 C)  Resp: 16   Body mass index is 33.98 kg/(m^2).  General: Alert, no acute distress HEENT:  Normocephalic, atraumatic, oropharynx patent. Left ear is normal. Right ear the canal is very slightly swollen. Partial blockage from wax. The drum is bulging and red. Eye: Donald Norman Cardiovascular:  Regular rate and rhythm, no rubs murmurs or gallops.  No Carotid bruits, radial pulse intact. No pedal edema.  Respiratory: Clear to auscultation bilaterally.  No wheezes, rales, or rhonchi.  No cyanosis, no use of accessory musculature Abdominal: No organomegaly, abdomen is soft and non-tender, positive bowel sounds.  No  masses. Musculoskeletal: Gait intact. No edema, tenderness Skin: No rashes. Neurologic: Facial musculature symmetric. Psychiatric: Patient acts appropriately throughout our interaction. Lymphatic: No cervical or submandibular lymphadenopathy  LABS: Results for orders placed or performed in visit on 09/17/14  PSA  Result Value Ref Range   PSA 3.66   Testosterone, Total & Free Direct  Result Value Ref Range   Testosterone 474    EKG/XRAY:   Primary read interpreted by Dr. Everlene [facility] at [facility].  ASSESSMENT/PLAN: Patient has a partial obstruction of his right external auditory canal with wax. The drum itself looks red and inflamed. Will treat with Augmentin and Astepro nasal spray. I told the patient if he is not significantly better by Wednesday to call and I would give him an appointment with ENT. I did not irrigate the ear because of the discomfort.I personally performed the services described in this documentation, which was scribed in my presence. The recorded information has been reviewed and is accurate. Gross sideeffects, risk and benefits, and alternatives of medications d/w patient. Patient is aware that all medications have potential sideeffects and we are unable to predict every sideeffect or drug-drug interaction that may occur.  By signing my name below, I, Donald Norman, attest that this documentation has been prepared under the direction and in the presence of Donald Jordan, MD.  Electronically Signed: Lora Norman, medical scribe. 09/27/2014, 8:37 AM.  Donald Queen MD 09/27/2014 8:27 AM

## 2014-09-27 NOTE — Patient Instructions (Signed)
Otitis Media With Effusion Otitis media with effusion is the presence of fluid in the middle ear. This is a common problem in children, which often follows ear infections. It may be present for weeks or longer after the infection. Unlike an acute ear infection, otitis media with effusion refers only to fluid behind the ear drum and not infection. Children with repeated ear and sinus infections and allergy problems are the most likely to get otitis media with effusion. CAUSES  The most frequent cause of the fluid buildup is dysfunction of the eustachian tubes. These are the tubes that drain fluid in the ears to the back of the nose (nasopharynx). SYMPTOMS   The main symptom of this condition is hearing loss. As a result, you or your child may:  Listen to the TV at a loud volume.  Not respond to questions.  Ask "what" often when spoken to.  Mistake or confuse one sound or word for another.  There may be a sensation of fullness or pressure but usually not pain. DIAGNOSIS   Your health care Raschelle Wisenbaker will diagnose this condition by examining you or your child's ears.  Your health care Mavryk Pino may test the pressure in you or your child's ear with a tympanometer.  A hearing test may be conducted if the problem persists. TREATMENT   Treatment depends on the duration and the effects of the effusion.  Antibiotics, decongestants, nose drops, and cortisone-type drugs (tablets or nasal spray) may not be helpful.  Children with persistent ear effusions may have delayed language or behavioral problems. Children at risk for developmental delays in hearing, learning, and speech may require referral to a specialist earlier than children not at risk.  You or your child's health care Jessica Checketts may suggest a referral to an ear, nose, and throat surgeon for treatment. The following may help restore normal hearing:  Drainage of fluid.  Placement of ear tubes (tympanostomy tubes).  Removal of adenoids  (adenoidectomy). HOME CARE INSTRUCTIONS   Avoid secondhand smoke.  Infants who are breastfed are less likely to have this condition.  Avoid feeding infants while they are lying flat.  Avoid known environmental allergens.  Avoid people who are sick. SEEK MEDICAL CARE IF:   Hearing is not better in 3 months.  Hearing is worse.  Ear pain.  Drainage from the ear.  Dizziness. MAKE SURE YOU:   Understand these instructions.  Will watch your condition.  Will get help right away if you are not doing well or get worse. Document Released: 01/27/2004 Document Revised: 05/05/2013 Document Reviewed: 07/16/2012 ExitCare Patient Information 2015 [employer]. This information is not intended to replace advice given to you by your health care Aletheia Tangredi. Make sure you discuss any questions you have with your health care Ashlei Chinchilla.  

## 2014-09-29 ENCOUNTER — Telehealth: Payer: Self-pay | Admitting: Family Medicine

## 2014-09-29 NOTE — Telephone Encounter (Signed)
Spoke with patient and he is scheduled for a CPE on 10/4.  His last CPE was on 11/21/13 so he is going to make sure that his insurance will cover for him to have one since it has been less than a year.  He states that they have switched policies at work and he should be ok but going to double check.

## 2014-10-01 ENCOUNTER — Telehealth: Payer: Self-pay | Admitting: Family Medicine

## 2014-10-01 NOTE — Telephone Encounter (Signed)
Spoke with patient about his CPE and he did call his insurance and verified that he can have another physical because it is a different calendar year.

## 2014-10-06 ENCOUNTER — Encounter: Payer: Self-pay | Admitting: Family Medicine

## 2014-10-06 ENCOUNTER — Ambulatory Visit (INDEPENDENT_AMBULATORY_CARE_PROVIDER_SITE_OTHER): Payer: BLUE CROSS/BLUE SHIELD | Admitting: Family Medicine

## 2014-10-06 VITALS — BP 132/82 | HR 76 | Temp 99.2°F | Resp 16 | Ht 72.0 in | Wt 250.2 lb

## 2014-10-06 DIAGNOSIS — I10 Essential (primary) hypertension: Secondary | ICD-10-CM | POA: Diagnosis not present

## 2014-10-06 DIAGNOSIS — H9193 Unspecified hearing loss, bilateral: Secondary | ICD-10-CM

## 2014-10-06 DIAGNOSIS — Z0000 Encounter for general adult medical examination without abnormal findings: Secondary | ICD-10-CM | POA: Diagnosis not present

## 2014-10-06 DIAGNOSIS — H6981 Other specified disorders of Eustachian tube, right ear: Secondary | ICD-10-CM

## 2014-10-06 DIAGNOSIS — Z1211 Encounter for screening for malignant neoplasm of colon: Secondary | ICD-10-CM

## 2014-10-06 DIAGNOSIS — Z6833 Body mass index (BMI) 33.0-33.9, adult: Secondary | ICD-10-CM | POA: Diagnosis not present

## 2014-10-06 DIAGNOSIS — F329 Major depressive disorder, single episode, unspecified: Secondary | ICD-10-CM

## 2014-10-06 DIAGNOSIS — E785 Hyperlipidemia, unspecified: Secondary | ICD-10-CM

## 2014-10-06 DIAGNOSIS — F32A Depression, unspecified: Secondary | ICD-10-CM

## 2014-10-06 LAB — COMPREHENSIVE METABOLIC PANEL
ALK PHOS: 49 U/L (ref 40–115)
ALT: 22 U/L (ref 9–46)
AST: 36 U/L — AB (ref 10–35)
Albumin: 4.3 g/dL (ref 3.6–5.1)
BILIRUBIN TOTAL: 1.2 mg/dL (ref 0.2–1.2)
BUN: 22 mg/dL (ref 7–25)
CO2: 27 mmol/L (ref 20–31)
Calcium: 9.2 mg/dL (ref 8.6–10.3)
Chloride: 102 mmol/L (ref 98–110)
Creat: 0.88 mg/dL (ref 0.70–1.25)
GLUCOSE: 101 mg/dL — AB (ref 65–99)
Potassium: 4.3 mmol/L (ref 3.5–5.3)
Sodium: 137 mmol/L (ref 135–146)
Total Protein: 6.8 g/dL (ref 6.1–8.1)

## 2014-10-06 LAB — CBC WITH DIFFERENTIAL/PLATELET
BASOS ABS: 0 10*3/uL (ref 0.0–0.1)
BASOS PCT: 0 % (ref 0–1)
EOS ABS: 0.2 10*3/uL (ref 0.0–0.7)
EOS PCT: 2 % (ref 0–5)
HCT: 51.3 % (ref 39.0–52.0)
Hemoglobin: 17.8 g/dL — ABNORMAL HIGH (ref 13.0–17.0)
Lymphocytes Relative: 19 % (ref 12–46)
Lymphs Abs: 1.5 10*3/uL (ref 0.7–4.0)
MCH: 31.1 pg (ref 26.0–34.0)
MCHC: 34.7 g/dL (ref 30.0–36.0)
MCV: 89.7 fL (ref 78.0–100.0)
MPV: 9.9 fL (ref 8.6–12.4)
Monocytes Absolute: 0.5 10*3/uL (ref 0.1–1.0)
Monocytes Relative: 6 % (ref 3–12)
Neutro Abs: 5.7 10*3/uL (ref 1.7–7.7)
Neutrophils Relative %: 73 % (ref 43–77)
PLATELETS: 222 10*3/uL (ref 150–400)
RBC: 5.72 MIL/uL (ref 4.22–5.81)
RDW: 13.1 % (ref 11.5–15.5)
WBC: 7.8 10*3/uL (ref 4.0–10.5)

## 2014-10-06 LAB — LIPID PANEL
CHOL/HDL RATIO: 3.8 ratio (ref <=5.0)
Cholesterol: 219 mg/dL — ABNORMAL HIGH (ref 125–200)
HDL: 57 mg/dL (ref >=40)
LDL CALC: 143 mg/dL — AB (ref <130)
Triglycerides: 97 mg/dL (ref <150)
VLDL: 19 mg/dL (ref <30)

## 2014-10-06 LAB — TSH: TSH: 1.908 u[IU]/mL (ref 0.350–4.500)

## 2014-10-06 LAB — HEMOGLOBIN A1C
HEMOGLOBIN A1C: 5.9 % — AB (ref <5.7)
Mean Plasma Glucose: 123 mg/dL — ABNORMAL HIGH (ref <117)

## 2014-10-06 MED ORDER — SERTRALINE HCL 25 MG PO TABS: 25.0000 mg | ORAL_TABLET | Freq: Every day | ORAL | Status: DC

## 2014-10-06 MED ORDER — LOSARTAN POTASSIUM 25 MG PO TABS: 25.0000 mg | ORAL_TABLET | Freq: Every day | ORAL | Status: DC

## 2014-10-06 NOTE — Patient Instructions (Signed)

## 2014-10-06 NOTE — Progress Notes (Signed)
Subjective:    Patient ID: Donald Norman., male    DOB: 12-Oct-1950, 64 y.o.   MRN: [id number]  HPI This is a pleasant 64 yo male who presents for CPE. Was seen by Dr. Everlene Farrier 09/27/14 for acute serous OM and was prescribed augmentin for 10 days and has one day left. No drainage, ear feels wet. No pain for several days after he started antibiotic and was taking sudafed 60 mg QD. Recently started a new job and has been eating out daily for lunch and working long hours.  Has been out of his cozaar.   Last CPE- 11/21/13 PSA- 06/09/14- sees urology twice a year for hypogonadism Colonoscopy- about 15 years ago, would like referral for screening Tdap- 11/2011 Zoster- 09/2012 Flu- annual Dental- regular Eye- regular Exercise- not regular  Review of Systems  HENT: Positive for congestion, ear pain, hearing loss (has had some work up from Dr. Simeon Craft, some exposure.), sinus pressure and tinnitus.        Had side effects with Nasacort and Flonase, antihistamines.   Eyes: Negative.   Respiratory: Negative.   Cardiovascular: Negative.   Gastrointestinal: Negative.   Endocrine: Negative.   Genitourinary: Positive for urgency and difficulty urinating.  Musculoskeletal: Negative.   Skin: Negative.   Allergic/Immunologic: Positive for environmental allergies.  Neurological: Positive for weakness.  Hematological: Negative.   Psychiatric/Behavioral: Negative.       Objective:   Physical Exam Physical Exam  Constitutional: He is oriented to person, place, and time. He appears well-developed and well-nourished. Obese. HENT:  Head: Normocephalic and atraumatic.  Right Ear: External ear normal. Canal normal. TM opaque and retracted. Left Ear: External ear normal. Canal slightly erythematous. TM normal.  Weber- lateralization to left ear, Rinne- AC>BC. Nose: Nose normal.  Mouth/Throat: Oropharynx is clear and moist.  Eyes: Conjunctivae are normal. Pupils are equal, round, and reactive to light.    Neck: Normal range of motion. Neck supple.  Cardiovascular: Normal rate, regular rhythm, normal heart sounds and intact distal pulses.   Pulmonary/Chest: Effort normal and breath sounds normal.  Abdominal: Soft. Bowel sounds are normal. Hernia confirmed negative in the right inguinal area and confirmed negative in the left inguinal area.  Genitourinary: Exam deferred, patient sees urology twice a year  Musculoskeletal: Normal range of motion. He exhibits no edema or tenderness.       Cervical back: Normal.       Thoracic back: Normal.       Lumbar back: Normal.  Lymphadenopathy:    He has no cervical adenopathy.       Right: No inguinal adenopathy present.       Left: No inguinal adenopathy present.  Neurological: He is alert and oriented to person, place, and time. He has normal reflexes.  Skin: Skin is warm and dry.  Psychiatric: He has a normal mood and affect. His behavior is normal. Judgment normal.  Vitals reviewed. BP 160/99 mmHg  Pulse 76  Temp(Src) 99.2 F (37.3 C) (Oral)  Resp 16  Ht 6' (1.829 m)  Wt 250 lb 3.2 oz (113.49 kg)  BMI 33.93 kg/m2 Wt Readings from Last 3 Encounters:  10/06/14 250 lb 3.2 oz (113.49 kg)  09/27/14 250 lb 9.6 oz (113.671 kg)  12/19/13 225 lb (102.059 kg)   Depression screen [facility] 2/9 10/06/2014 09/27/2014 09/27/2014 12/19/2013  Decreased Interest 0 0 0 1  Down, Depressed, Hopeless 0 0 0 1  PHQ - 2 Score 0 0 0 2  Altered sleeping - - -  0  Tired, decreased energy - - - 1  Change in appetite - - - 0  Feeling bad or failure about yourself  - - - 0  Trouble concentrating - - - 0  Moving slowly or fidgety/restless - - - 0  Suicidal thoughts - - - 0  PHQ-9 Score - - - 3      Assessment & Plan:  1. Annual physical exam - Discussed need for healthy behaviors including weight loss, regular exercise, good sleep hygiene.   2. Hyperlipidemia - Lipid panel  3. Essential hypertension - patient recently off losartan and taking high doses of sudafed.  Discussed importance of taking anti hypertensive medication, avoiding decongestants, weight loss - CBC with Differential/Platelet - Comprehensive metabolic panel - losartan (COZAAR) 25 MG tablet; Take 1 tablet (25 mg total) by mouth daily.  Dispense: 90 tablet; Refill: 3  4. ETD (eustachian tube dysfunction), right - Ambulatory referral to ENT  5. BMI 33.0-33.9,adult - encouraged him to pack his lunch daily, to avoid skipping meals, to increase exercise - Comprehensive metabolic panel - Lipid panel - TSH - Hemoglobin A1c  6. Depression - patient reports that his depression is well controlled on current medication - sertraline (ZOLOFT) 25 MG tablet; Take 1 tablet (25 mg total) by mouth daily.  Dispense: 90 tablet; Refill: 3  7. Screening for colon cancer - Ambulatory referral to Gastroenterology  8. Hearing loss, bilateral - Ambulatory referral to ENT  - follow up 6 months Clarene Reamer, FNP-BC  [facility]  10/08/2014 8:07 PM

## 2014-10-07 ENCOUNTER — Encounter: Payer: Self-pay | Admitting: Family Medicine

## 2014-11-06 ENCOUNTER — Other Ambulatory Visit: Payer: Self-pay

## 2014-11-06 MED ORDER — AZELASTINE HCL 0.1 % NA SOLN: 2.0000 | Freq: Two times a day (BID) | NASAL | Status: DC

## 2015-01-28 ENCOUNTER — Other Ambulatory Visit: Payer: Self-pay

## 2015-01-28 DIAGNOSIS — I10 Essential (primary) hypertension: Secondary | ICD-10-CM

## 2015-01-28 MED ORDER — LOSARTAN POTASSIUM 25 MG PO TABS: 25.0000 mg | ORAL_TABLET | Freq: Every day | ORAL | Status: DC

## 2015-04-05 ENCOUNTER — Other Ambulatory Visit: Payer: Self-pay

## 2015-04-05 DIAGNOSIS — I10 Essential (primary) hypertension: Secondary | ICD-10-CM

## 2015-04-05 MED ORDER — LOSARTAN POTASSIUM 25 MG PO TABS: 25.0000 mg | ORAL_TABLET | Freq: Every day | ORAL | Status: DC

## 2015-04-06 ENCOUNTER — Ambulatory Visit: Payer: BLUE CROSS/BLUE SHIELD | Admitting: Family Medicine

## 2015-04-07 ENCOUNTER — Other Ambulatory Visit: Payer: Self-pay

## 2015-04-07 DIAGNOSIS — F32A Depression, unspecified: Secondary | ICD-10-CM

## 2015-04-07 DIAGNOSIS — F329 Major depressive disorder, single episode, unspecified: Secondary | ICD-10-CM

## 2015-04-07 MED ORDER — SERTRALINE HCL 25 MG PO TABS: 25.0000 mg | ORAL_TABLET | Freq: Every day | ORAL | Status: AC

## 2015-04-07 MED ORDER — AZELASTINE HCL 0.1 % NA SOLN: 2.0000 | Freq: Two times a day (BID) | NASAL | Status: DC

## 2015-06-04 ENCOUNTER — Ambulatory Visit (INDEPENDENT_AMBULATORY_CARE_PROVIDER_SITE_OTHER): Payer: Managed Care, Other (non HMO) | Admitting: Ophthalmology

## 2015-06-04 DIAGNOSIS — H43813 Vitreous degeneration, bilateral: Secondary | ICD-10-CM

## 2015-06-04 DIAGNOSIS — H35033 Hypertensive retinopathy, bilateral: Secondary | ICD-10-CM | POA: Diagnosis not present

## 2015-06-04 DIAGNOSIS — I10 Essential (primary) hypertension: Secondary | ICD-10-CM

## 2015-06-04 DIAGNOSIS — H353131 Nonexudative age-related macular degeneration, bilateral, early dry stage: Secondary | ICD-10-CM | POA: Diagnosis not present

## 2015-06-04 DIAGNOSIS — H33303 Unspecified retinal break, bilateral: Secondary | ICD-10-CM

## 2016-04-01 ENCOUNTER — Ambulatory Visit (INDEPENDENT_AMBULATORY_CARE_PROVIDER_SITE_OTHER): Payer: Medicare Other | Admitting: Family Medicine

## 2016-04-01 VITALS — BP 140/80 | HR 66 | Temp 99.5°F | Resp 20 | Ht 71.75 in | Wt 251.2 lb

## 2016-04-01 DIAGNOSIS — J189 Pneumonia, unspecified organism: Secondary | ICD-10-CM

## 2016-04-01 MED ORDER — HYDROCOD POLST-CPM POLST ER 10-8 MG/5ML PO SUER: 5.0000 mL | Freq: Two times a day (BID) | ORAL | 0 refills | Status: DC | PRN

## 2016-04-01 MED ORDER — BENZONATATE 200 MG PO CAPS: 200.0000 mg | ORAL_CAPSULE | Freq: Three times a day (TID) | ORAL | 0 refills | Status: DC | PRN

## 2016-04-01 MED ORDER — AZITHROMYCIN 250 MG PO TABS: ORAL_TABLET | ORAL | 0 refills | Status: DC

## 2016-04-01 NOTE — Patient Instructions (Addendum)
Try Mucinex DM or Delsym during the day as these last 12 hours   IF you received an x-ray today, you will receive an invoice from [facility]. Please contact [facility] at [phone] with questions or concerns regarding your invoice.   IF you received labwork today, you will receive an invoice from [facility]. Please contact [facility] at [phone] with questions or concerns regarding your invoice.   Our billing staff will not be able to assist you with questions regarding bills from these companies.  You will be contacted with the lab results as soon as they are available. The fastest way to get your results is to activate your My Chart account. Instructions are located on the last page of this paperwork. If you have not heard from [place] regarding the results in 2 weeks, please contact this office.     Acute Bronchitis, Adult (similar instructions to what I would give for your illness) Acute bronchitis is sudden (acute) swelling of the air tubes (bronchi) in the lungs. Acute bronchitis causes these tubes to fill with mucus, which can make it hard to breathe. It can also cause coughing or wheezing. In adults, acute bronchitis usually goes away within 2 weeks. A cough caused by bronchitis may last up to 3 weeks. Smoking, allergies, and asthma can make the condition worse. Repeated episodes of bronchitis may cause further lung problems, such as chronic obstructive pulmonary disease (COPD). What are the causes? This condition can be caused by germs and by substances that irritate the lungs, including:  Cold and flu viruses. This condition is most often caused by the same virus that causes a cold.  Bacteria.  Exposure to tobacco smoke, dust, fumes, and air pollution. What increases the risk? This condition is more likely to develop in people who:  Have close contact with someone with acute bronchitis.  Are exposed to lung irritants, such as tobacco smoke, dust, fumes, and  vapors.  Have a weak immune system.  Have a respiratory condition such as asthma. What are the signs or symptoms? Symptoms of this condition include:  A cough.  Coughing up clear, yellow, or green mucus.  Wheezing.  Chest congestion.  Shortness of breath.  A fever.  Body aches.  Chills.  A sore throat. How is this diagnosed? This condition is usually diagnosed with a physical exam. During the exam, your health care provider may order tests, such as chest X-rays, to rule out other conditions. He or she may also:  Test a sample of your mucus for bacterial infection.  Check the level of oxygen in your blood. This is done to check for pneumonia.  Do a chest X-ray or lung function testing to rule out pneumonia and other conditions.  Perform blood tests. Your health care provider will also ask about your symptoms and medical history. How is this treated? Most cases of acute bronchitis clear up over time without treatment. Your health care provider may recommend:  Drinking more fluids. Drinking more makes your mucus thinner, which may make it easier to breathe.  Taking a medicine for a fever or cough.  Taking an antibiotic medicine.  Using an inhaler to help improve shortness of breath and to control a cough.  Using a cool mist vaporizer or humidifier to make it easier to breathe. Follow these instructions at home: Medicines   Take over-the-counter and prescription medicines only as told by your health care provider.  If you were prescribed an antibiotic, take it as told by your health  care provider. Do not stop taking the antibiotic even if you start to feel better. General instructions   Get plenty of rest.  Drink enough fluids to keep your urine clear or pale yellow.  Avoid smoking and secondhand smoke. Exposure to cigarette smoke or irritating chemicals will make bronchitis worse. If you smoke and you need help quitting, ask your health care provider. Quitting  smoking will help your lungs heal faster.  Use an inhaler, cool mist vaporizer, or humidifier as told by your health care provider.  Keep all follow-up visits as told by your health care provider. This is important. How is this prevented? To lower your risk of getting this condition again:  Wash your hands often with soap and water. If soap and water are not available, use hand sanitizer.  Avoid contact with people who have cold symptoms.  Try not to touch your hands to your mouth, nose, or eyes.  Make sure to get the flu shot every year. Contact a health care provider if:  Your symptoms do not improve in 2 weeks of treatment. Get help right away if:  You cough up blood.  You have chest pain.  You have severe shortness of breath.  You become dehydrated.  You faint or keep feeling like you are going to faint.  You keep vomiting.  You have a severe headache.  Your fever or chills gets worse. This information is not intended to replace advice given to you by your health care provider. Make sure you discuss any questions you have with your health care provider. Document Released: 01/27/2004 Document Revised: 07/14/2015 Document Reviewed: 06/09/2015 Elsevier Interactive Patient Education  2017 [employer].

## 2016-04-01 NOTE — Progress Notes (Signed)
Subjective:  By signing my name below, I, Donald Norman, attest that this documentation has been prepared under the direction and in the presence of Delman Cheadle, MD. Electronically Signed: Moises Norman, [place]. 04/01/2016 , 8:58 AM .  Patient was seen in Room 1 .   Patient ID: Donald Cupp., male    DOB: March 23, 1950, 66 y.o.   MRN: [id number] Chief Complaint  Patient presents with  . Scratchy Throat  . Nasal Congestion  . Cough  . Sinus Drainage   HPI Donald Norman. is a 66 y.o. male who presents to Primary Care at [facility] complaining of scratchy throat, nasal congestion, cough and sinus pressure. Patient reports his symptoms started about 10 days ago. He initially thought it was due to allergies, but congestion became worse with scratchy throat. He describes his cough has been "the most violent cough, almost like choking", to the point of feeling faint. He also notes having fever (Tmax 100.5). He mentions having pressure in his sinuses. He's been using Afrin nasal spray and robitussin-DM. He's been occasionally coughing up phlegm. He denies blowing out much nasal drainage. He denies appetite change, or shortness of breath.   He reports being diagnosed with asthma in the past, and was prescribed inhaler. But, he states he did not feel any changes or relief with inhaler.   Past Medical History:  Diagnosis Date  . Allergy   . Anemia   . Basal cell cancer   . Cancer ([place])   . Depression   . Heart murmur   . Hypertension   . PVC (premature ventricular contraction)   . Tendonitis    Prior to Admission medications   Medication Sig Start Date End Date Taking? Authorizing Provider  ANDROGEL PUMP 20.25 MG/ACT (1.62%) GEL  10/29/13  Yes Historical Provider, MD  azelastine (ASTELIN) 0.1 % nasal spray Place 2 sprays into both nostrils 2 (two) times daily. Use in each nostril as directed 04/07/15  Yes Elby Beck, FNP  losartan (COZAAR) 25 MG tablet Take 1 tablet (25 mg total) by mouth  daily. 04/05/15  Yes Shawnee Knapp, MD  sertraline (ZOLOFT) 25 MG tablet Take 1 tablet (25 mg total) by mouth daily. 04/07/15  Yes Elby Beck, FNP   No Known Allergies  Review of Systems  Constitutional: Positive for fever. Negative for activity change, appetite change, chills and fatigue.  HENT: Positive for congestion, rhinorrhea, sinus pressure and sore throat. Negative for sinus pain.   Respiratory: Positive for cough. Negative for shortness of breath and wheezing.        Objective:   Physical Exam  Constitutional: He is oriented to person, place, and time. He appears well-developed and well-nourished. No distress.  HENT:  Head: Normocephalic and atraumatic.  Right Ear: A middle ear effusion is present.  Left Ear: A middle ear effusion is present.  Nose: Nose normal.  Mouth/Throat: Oropharynx is clear and moist and mucous membranes are normal.  Bilateral mid ear effusion with TM opacified  Eyes: EOM are normal. Pupils are equal, round, and reactive to light.  Neck: Neck supple.  Cardiovascular: Normal rate.   Pulmonary/Chest: Effort normal. No respiratory distress.  Musculoskeletal: Normal range of motion.  Lymphadenopathy:       Head (right side): Tonsillar adenopathy present.       Head (left side): Tonsillar adenopathy present.  Neurological: He is alert and oriented to person, place, and time.  Skin: Skin is warm and dry.  Psychiatric: He has a  normal mood and affect. His behavior is normal.  Nursing note and vitals reviewed.   BP 140/80   Pulse 66   Temp 99.5 F (37.5 C) (Oral)   Resp 20   Ht 5' 11.75" (1.822 m)   Wt 251 lb 4 oz (114 kg)   SpO2 94%   BMI 34.31 kg/m     Assessment & Plan:   1. Walking pneumonia     Orders Placed This Encounter  Procedures  . Care order/instruction:    AVS printed - let patient go!    Meds ordered this encounter  Medications  . chlorpheniramine-HYDROcodone (TUSSIONEX PENNKINETIC ER) 10-8 MG/5ML SUER    Sig: Take 5  mLs by mouth every 12 (twelve) hours as needed.    Dispense:  120 mL    Refill:  0  . azithromycin (ZITHROMAX) 250 MG tablet    Sig: Take 2 tabs PO x 1 dose, then 1 tab PO QD x 4 days    Dispense:  6 tablet    Refill:  0  . benzonatate (TESSALON) 200 MG capsule    Sig: Take 1 capsule (200 mg total) by mouth 3 (three) times daily as needed for cough.    Dispense:  60 capsule    Refill:  0    I personally performed the services described in this documentation, which was scribed in my presence. The recorded information has been reviewed and considered, and addended by me as needed.   Delman Cheadle, M.D.  Primary Care at [facility] [address] [place], [place] [address] [phone] phone [phone] fax  04/03/16 10:06 PM

## 2016-06-05 ENCOUNTER — Ambulatory Visit (INDEPENDENT_AMBULATORY_CARE_PROVIDER_SITE_OTHER): Payer: Medicare Other | Admitting: Ophthalmology

## 2016-06-05 DIAGNOSIS — H353131 Nonexudative age-related macular degeneration, bilateral, early dry stage: Secondary | ICD-10-CM

## 2016-06-05 DIAGNOSIS — H35033 Hypertensive retinopathy, bilateral: Secondary | ICD-10-CM

## 2016-06-05 DIAGNOSIS — H43813 Vitreous degeneration, bilateral: Secondary | ICD-10-CM | POA: Diagnosis not present

## 2016-06-05 DIAGNOSIS — H33303 Unspecified retinal break, bilateral: Secondary | ICD-10-CM | POA: Diagnosis not present

## 2016-06-05 DIAGNOSIS — I10 Essential (primary) hypertension: Secondary | ICD-10-CM

## 2016-06-05 DIAGNOSIS — H2513 Age-related nuclear cataract, bilateral: Secondary | ICD-10-CM | POA: Diagnosis not present

## 2017-06-08 ENCOUNTER — Ambulatory Visit (INDEPENDENT_AMBULATORY_CARE_PROVIDER_SITE_OTHER): Payer: Medicare Other | Admitting: Ophthalmology

## 2017-08-06 ENCOUNTER — Encounter (INDEPENDENT_AMBULATORY_CARE_PROVIDER_SITE_OTHER): Payer: Medicare Other | Admitting: Ophthalmology

## 2017-08-06 DIAGNOSIS — H33303 Unspecified retinal break, bilateral: Secondary | ICD-10-CM | POA: Diagnosis not present

## 2017-08-06 DIAGNOSIS — H35033 Hypertensive retinopathy, bilateral: Secondary | ICD-10-CM | POA: Diagnosis not present

## 2017-08-06 DIAGNOSIS — H353131 Nonexudative age-related macular degeneration, bilateral, early dry stage: Secondary | ICD-10-CM

## 2017-08-06 DIAGNOSIS — I10 Essential (primary) hypertension: Secondary | ICD-10-CM

## 2017-08-06 DIAGNOSIS — H2513 Age-related nuclear cataract, bilateral: Secondary | ICD-10-CM

## 2017-08-06 DIAGNOSIS — H43813 Vitreous degeneration, bilateral: Secondary | ICD-10-CM

## 2018-01-17 ENCOUNTER — Other Ambulatory Visit: Payer: Self-pay | Admitting: Family Medicine

## 2018-01-17 DIAGNOSIS — R2241 Localized swelling, mass and lump, right lower limb: Secondary | ICD-10-CM

## 2018-01-22 ENCOUNTER — Ambulatory Visit
Admission: RE | Admit: 2018-01-22 | Discharge: 2018-01-22 | Disposition: A | Discharge status: Home / Self Care | Payer: Medicare Other | Source: Ambulatory Visit | Attending: Family Medicine | Admitting: Family Medicine

## 2018-01-22 DIAGNOSIS — R2241 Localized swelling, mass and lump, right lower limb: Secondary | ICD-10-CM

## 2018-08-07 ENCOUNTER — Encounter (INDEPENDENT_AMBULATORY_CARE_PROVIDER_SITE_OTHER): Payer: Medicare Other | Admitting: Ophthalmology

## 2019-02-02 ENCOUNTER — Ambulatory Visit: Payer: Medicare Other

## 2019-02-03 ENCOUNTER — Ambulatory Visit: Payer: Medicare Other | Attending: Internal Medicine

## 2019-02-03 DIAGNOSIS — Z20822 Contact with and (suspected) exposure to covid-19: Secondary | ICD-10-CM

## 2019-02-04 LAB — NOVEL CORONAVIRUS, NAA: SARS-CoV-2, NAA: NOT DETECTED

## 2019-02-13 ENCOUNTER — Ambulatory Visit: Payer: Medicare Other

## 2020-01-26 DIAGNOSIS — Z20822 Contact with and (suspected) exposure to covid-19: Secondary | ICD-10-CM | POA: Diagnosis not present

## 2020-02-24 DIAGNOSIS — E349 Endocrine disorder, unspecified: Secondary | ICD-10-CM | POA: Diagnosis not present

## 2020-02-24 DIAGNOSIS — I10 Essential (primary) hypertension: Secondary | ICD-10-CM | POA: Diagnosis not present

## 2020-02-24 DIAGNOSIS — L309 Dermatitis, unspecified: Secondary | ICD-10-CM | POA: Diagnosis not present

## 2020-02-24 DIAGNOSIS — Z1389 Encounter for screening for other disorder: Secondary | ICD-10-CM | POA: Diagnosis not present

## 2020-02-24 DIAGNOSIS — Z8616 Personal history of COVID-19: Secondary | ICD-10-CM | POA: Diagnosis not present

## 2020-02-24 DIAGNOSIS — Z23 Encounter for immunization: Secondary | ICD-10-CM | POA: Diagnosis not present

## 2020-02-24 DIAGNOSIS — Z0000 Encounter for general adult medical examination without abnormal findings: Secondary | ICD-10-CM | POA: Diagnosis not present

## 2020-02-24 DIAGNOSIS — J309 Allergic rhinitis, unspecified: Secondary | ICD-10-CM | POA: Diagnosis not present

## 2020-02-24 DIAGNOSIS — H9193 Unspecified hearing loss, bilateral: Secondary | ICD-10-CM | POA: Diagnosis not present

## 2020-02-24 DIAGNOSIS — E782 Mixed hyperlipidemia: Secondary | ICD-10-CM | POA: Diagnosis not present

## 2020-05-21 DIAGNOSIS — C44319 Basal cell carcinoma of skin of other parts of face: Secondary | ICD-10-CM | POA: Diagnosis not present

## 2020-05-21 DIAGNOSIS — L570 Actinic keratosis: Secondary | ICD-10-CM | POA: Diagnosis not present

## 2020-05-21 DIAGNOSIS — Z08 Encounter for follow-up examination after completed treatment for malignant neoplasm: Secondary | ICD-10-CM | POA: Diagnosis not present

## 2020-05-21 DIAGNOSIS — Z1283 Encounter for screening for malignant neoplasm of skin: Secondary | ICD-10-CM | POA: Diagnosis not present

## 2020-05-21 DIAGNOSIS — X32XXXA Exposure to sunlight, initial encounter: Secondary | ICD-10-CM | POA: Diagnosis not present

## 2020-05-21 DIAGNOSIS — L308 Other specified dermatitis: Secondary | ICD-10-CM | POA: Diagnosis not present

## 2020-05-21 DIAGNOSIS — Z85828 Personal history of other malignant neoplasm of skin: Secondary | ICD-10-CM | POA: Diagnosis not present

## 2020-05-21 DIAGNOSIS — L281 Prurigo nodularis: Secondary | ICD-10-CM | POA: Diagnosis not present

## 2020-12-17 DIAGNOSIS — Z03818 Encounter for observation for suspected exposure to other biological agents ruled out: Secondary | ICD-10-CM | POA: Diagnosis not present

## 2020-12-17 DIAGNOSIS — J069 Acute upper respiratory infection, unspecified: Secondary | ICD-10-CM | POA: Diagnosis not present

## 2021-03-28 DIAGNOSIS — Z1211 Encounter for screening for malignant neoplasm of colon: Secondary | ICD-10-CM | POA: Diagnosis not present

## 2021-03-28 DIAGNOSIS — Z8616 Personal history of COVID-19: Secondary | ICD-10-CM | POA: Diagnosis not present

## 2021-03-28 DIAGNOSIS — I10 Essential (primary) hypertension: Secondary | ICD-10-CM | POA: Diagnosis not present

## 2021-03-28 DIAGNOSIS — Z0000 Encounter for general adult medical examination without abnormal findings: Secondary | ICD-10-CM | POA: Diagnosis not present

## 2021-03-28 DIAGNOSIS — R002 Palpitations: Secondary | ICD-10-CM | POA: Diagnosis not present

## 2021-03-28 DIAGNOSIS — E349 Endocrine disorder, unspecified: Secondary | ICD-10-CM | POA: Diagnosis not present

## 2021-03-28 DIAGNOSIS — H9193 Unspecified hearing loss, bilateral: Secondary | ICD-10-CM | POA: Diagnosis not present

## 2021-03-28 DIAGNOSIS — Z1389 Encounter for screening for other disorder: Secondary | ICD-10-CM | POA: Diagnosis not present

## 2021-03-28 DIAGNOSIS — J309 Allergic rhinitis, unspecified: Secondary | ICD-10-CM | POA: Diagnosis not present

## 2021-03-28 DIAGNOSIS — E782 Mixed hyperlipidemia: Secondary | ICD-10-CM | POA: Diagnosis not present

## 2021-04-13 DIAGNOSIS — R948 Abnormal results of function studies of other organs and systems: Secondary | ICD-10-CM | POA: Diagnosis not present

## 2021-04-19 ENCOUNTER — Encounter: Payer: Self-pay | Admitting: Cardiovascular Disease

## 2021-04-19 ENCOUNTER — Ambulatory Visit (INDEPENDENT_AMBULATORY_CARE_PROVIDER_SITE_OTHER): Payer: Medicare Other

## 2021-04-19 ENCOUNTER — Ambulatory Visit: Payer: Medicare Other | Admitting: Cardiovascular Disease

## 2021-04-19 DIAGNOSIS — R002 Palpitations: Secondary | ICD-10-CM

## 2021-04-19 DIAGNOSIS — G4733 Obstructive sleep apnea (adult) (pediatric): Secondary | ICD-10-CM

## 2021-04-19 DIAGNOSIS — I10 Essential (primary) hypertension: Secondary | ICD-10-CM

## 2021-04-19 NOTE — Patient Instructions (Signed)
Medication Instructions:  ?Your physician recommends that you continue on your current medications as directed. Please refer to the Current Medication list given to you today. ? ?*If you need a refill on your cardiac medications before your next appointment, please call your pharmacy* ? ? ?Testing/Procedures: ?Your physician has requested that you have an echocardiogram. Echocardiography is a painless test that uses sound waves to create images of your heart. It provides your doctor with information about the size and shape of your heart and how well your heart?s chambers and valves are working. This procedure takes approximately one hour. There are no restrictions for this procedure. This procedure will be done at [address] ? ?Your physician has recommended that you have a sleep study. This test records several body functions during sleep, including: brain activity, eye movement, oxygen and carbon dioxide blood levels, heart rate and rhythm, breathing rate and rhythm, the flow of air through your mouth and nose, snoring, body muscle movements, and chest and belly movement.  ? ? ?Dr. Gwenlyn Found has ordered a CT coronary calcium score.  ? ?Test locations:  ?[facility] ([address] [place], [place] [address]) ?[facility] ([address] [place], [place] [address])  ? ?This is $99 out of pocket. ? ? ?Coronary CalciumScan ?A coronary calcium scan is an imaging test used to look for deposits of calcium and other fatty materials (plaques) in the inner lining of the blood vessels of the heart (coronary arteries). These deposits of calcium and plaques can partly clog and narrow the coronary arteries without producing any symptoms or warning signs. This puts a person at risk for a heart attack. This test can detect these deposits before symptoms develop. ?Tell a health care provider about: ?Any allergies you have. ?All medicines you are taking, including vitamins, herbs, eye drops, creams,  and over-the-counter medicines. ?Any problems you or family members have had with anesthetic medicines. ?Any blood disorders you have. ?Any surgeries you have had. ?Any medical conditions you have. ?Whether you are pregnant or may be pregnant. ?What are the risks? ?Generally, this is a safe procedure. However, problems may occur, including: ?Harm to a pregnant woman and her unborn baby. This test involves the use of radiation. Radiation exposure can be dangerous to a pregnant woman and her unborn baby. If you are pregnant, you generally should not have this procedure done. ?Slight increase in the risk of cancer. This is because of the radiation involved in the test. ?What happens before the procedure? ?No preparation is needed for this procedure. ?What happens during the procedure? ?You will undress and remove any jewelry around your neck or chest. ?You will put on a hospital gown. ?Sticky electrodes will be placed on your chest. The electrodes will be connected to an electrocardiogram (ECG) machine to record a tracing of the electrical activity of your heart. ?A CT scanner will take pictures of your heart. During this time, you will be asked to lie still and hold your breath for 2-3 seconds while a picture of your heart is being taken. ?The procedure may vary among health care providers and hospitals. ?What happens after the procedure? ?You can get dressed. ?You can return to your normal activities. ?It is up to you to get the results of your test. Ask your health care provider, or the department that is doing the test, when your results will be ready. ?Summary ?A coronary calcium scan is an imaging test used to look for deposits of  calcium and other fatty materials (plaques) in the inner lining of the blood vessels of the heart (coronary arteries). ?Generally, this is a safe procedure. Tell your health care provider if you are pregnant or may be pregnant. ?No preparation is needed for this procedure. ?A CT scanner  will take pictures of your heart. ?You can return to your normal activities after the scan is done. ?This information is not intended to replace advice given to you by your health care provider. Make sure you discuss any questions you have with your health care provider. ?Document Released: 06/17/2007 Document Revised: 11/08/2015 Document Reviewed: 11/08/2015 ?Elsevier Interactive Patient Education ? 2017 Elsevier Inc. ? ? ?ZIO XT- Long Term Monitor Instructions ? ?Your physician has requested you wear a ZIO patch monitor for 14 days.  ?This is a single patch monitor. [employer] supplies one patch monitor per enrollment. Additional ?stickers are not available. Please do not apply patch if you will be having a Nuclear Stress Test,  ?Echocardiogram, Cardiac CT, MRI, or Chest Xray during the period you would be wearing the  ?monitor. The patch cannot be worn during these tests. You cannot remove and re-apply the  ?ZIO XT patch monitor.  ?Your ZIO patch monitor will be mailed 3 day USPS to your address on file. It may take 3-5 days  ?to receive your monitor after you have been enrolled.  ?Once you have received your monitor, please review the enclosed instructions. Your monitor  ?has already been registered assigning a specific monitor serial # to you. ? ?Billing and Patient Assistance Program Information ? ?We have supplied [employer] with any of your insurance information on file for billing purposes. ?[employer] offers a sliding scale Patient Assistance Program for patients that do not have  ?insurance, or whose insurance does not completely cover the cost of the ZIO monitor.  ?You must apply for the Patient Assistance Program to qualify for this discounted rate.  ?To apply, please call [employer] at [phone], select option 4, select option 2, ask to apply for  ?Patient Assistance Program. [name] will ask your household income, and how many people  ?are in your household. They will quote your out-of-pocket cost based on  that information.  ?[employer] will also be able to set up a 28-month interest-free payment plan if needed. ? ?Applying the monitor ?  ?Shave hair from upper left chest.  ?Hold abrader disc by orange tab. Rub abrader in 40 strokes over the upper left chest as  ?indicated in your monitor instructions.  ?Clean area with 4 enclosed alcohol pads. Let dry.  ?Apply patch as indicated in monitor instructions. Patch will be placed under collarbone on left  ?side of chest with arrow pointing upward.  ?Rub patch adhesive wings for 2 minutes. Remove white label marked "1". Remove the white  ?label marked "2". Rub patch adhesive wings for 2 additional minutes.  ?While looking in a mirror, press and release button in center of patch. A small green light will  ?flash 3-4 times. This will be your only indicator that the monitor has been turned on.  ?Do not shower for the first 24 hours. You may shower after the first 24 hours.  ?Press the button if you feel a symptom. You will hear a small click. Record Date, Time and  ?Symptom in the Patient Logbook.  ?When you are ready to remove the patch, follow instructions on the last 2 pages of Patient  ?Logbook. Stick patch monitor onto the last page of Patient Logbook.  ?  Place Patient Logbook in the blue and white box. Use locking tab on box and tape box closed  ?securely. The blue and white box has prepaid postage on it. Please place it in the mailbox as  ?soon as possible. Your physician should have your test results approximately 7 days after the  ?monitor has been mailed back to [facility].  ?Call [facility] at [phone] if you have questions regarding  ?your ZIO XT patch monitor. Call them immediately if you see an orange light blinking on your  ?monitor.  ?If your monitor falls off in less than 4 days, contact our Monitor department at [phone].  ?If your monitor becomes loose or falls off after 4 days call [employer] at [phone] for  ?suggestions on  securing your monitor ? ? ? ?Follow-Up: ?At [facility], you and your health needs are our priority.  As part of our continuing mission to provide you with exceptional heart care, we have created desi

## 2021-04-19 NOTE — Progress Notes (Unsigned)
Enrolled patient for a 14 day Zio XT  monitor to be mailed to patients home  °

## 2021-04-19 NOTE — Progress Notes (Signed)
? ? ? ?04/19/2021 ?Donald Norman.   ?06-May-1950  ?[id number] ? ?Primary Physician Antony Contras, MD ?Primary Cardiologist: Lorretta Harp MD Donald Norman, [place] ? ?HPI:  Donald Coll. is a 71 y.o. 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  He was referred by Dr. Antony Contras  for evaluation of palpitations.  Apparently I take care of his Sister Donald Norman as well as his mother Donald Norman who died at age 84.  His cardiac risk factor profile is remarkable for treated hypertension, untreated hyperlipidemia.  His father did die of a myocardial infarction.  He is never had a heart attack or stroke.  He denies chest pain or shortness of breath.  He noticed the onset of tachypalpitations July 2022.  He had 15 episodes which she is captured on his "apple watch" which last for seconds to minutes at a time and are self-limited without symptoms.  He has decreased his caffeine intake and has not had any symptoms in the last 3 months. ? ? ?Current Meds  ?Medication Sig  ? aspirin 81 MG EC tablet   ? diclofenac Sodium (VOLTAREN) 1 % GEL   ? fluticasone (FLONASE) 50 MCG/ACT nasal spray Place into the nose.  ? Ibuprofen 200 MG CAPS   ? levocetirizine (XYZAL) 5 MG tablet   ? losartan (COZAAR) 25 MG tablet Take 1 tablet (25 mg total) by mouth daily.  ? Multiple Vitamin (MULTI-VITAMIN DAILY) TABS   ? Polyethyl Glycol-Propyl Glycol (SYSTANE) 0.4-0.3 % SOLN   ? sertraline (ZOLOFT) 25 MG tablet Take 1 tablet (25 mg total) by mouth daily.  ? Testosterone 20.25 MG/ACT (1.62%) GEL AndroGel 20.25 mg/1.25 gram (1.62 %) transdermal gel pump  ?  ? ?No Known Allergies ? ?Social History  ? ?Socioeconomic History  ? Marital status: Married  ?  Spouse name: Not on file  ? Number of children: Not on file  ? Years of education: college  ? Highest education level: Not on file  ?Occupational History  ? Occupation:  Scientist, research (physical sciences)  ?Tobacco Use  ? Smoking status: Never  ? Smokeless tobacco: Never  ?Substance and Sexual Activity  ? Alcohol use: No  ?  Alcohol/week: 2.0 standard drinks  ?  Types: 2 Standard drinks or equivalent per week  ? Drug use: No  ? Sexual activity: Yes  ?  Partners: Female  ?Other Topics Concern  ? Not on file  ?Social History Narrative  ? Not on file  ? ?Social Determinants of Health  ? ?Financial Resource Strain: Not on file  ?Food Insecurity: Not on file  ?Transportation Needs: Not on file  ?Physical Activity: Not on file  ?Stress: Not on file  ?Social Connections: Not on file  ?Intimate Partner Violence: Not on file  ?  ? ?Review of Systems: ?General: negative for chills, fever, night sweats or weight changes.  ?Cardiovascular: negative for chest pain, dyspnea on exertion, edema, orthopnea, palpitations, paroxysmal nocturnal dyspnea or shortness of breath ?Dermatological: negative for rash ?Respiratory: negative for cough or wheezing ?Urologic: negative for hematuria ?Abdominal: negative for nausea, vomiting, diarrhea, bright red blood per rectum, melena, or hematemesis ?Neurologic: negative for visual changes, syncope, or dizziness ?All other systems reviewed and are otherwise negative except as noted above. ? ? ? ?Blood pressure (!) 152/80, pulse 82, height '5\' 11"'$  (1.803 m), weight 222 lb (100.7 kg), SpO2 97 %.  ?General appearance:  alert and no distress ?Neck: no adenopathy, no carotid bruit, no JVD, supple, symmetrical, trachea midline, and thyroid not enlarged, symmetric, no tenderness/mass/nodules ?Lungs: clear to auscultation bilaterally ?Heart: regular rate and rhythm, S1, S2 normal, no murmur, click, rub or gallop ?Extremities: extremities normal, atraumatic, no cyanosis or edema ?Pulses: 2+ and symmetric ?Skin: Skin color, texture, turgor normal. No rashes or lesions ?Neurologic: Grossly normal ? ?EKG sinus rhythm at 82 without ST or T wave changes.  I personally reviewed this EKG. ? ?ASSESSMENT  AND PLAN:  ? ?Hypertension ?History of essential hypertension a blood pressure measured today 152/80.  He is on low-dose losartan. ? ?Palpitations ?Mr. Maack was referred to me by Dr. Moreen Fowler for palpitations which began last July.  He has had 15 or so episodes that he has caught on his "apple watch".  They last for seconds at a time.  He is relatively asymptomatic during these.  He has since reduced his caffeine intake.  He is had no episodes over the last 3 months.  His thyroid function test ordered by his PCP were normal.  He does have symptoms compatible with obstructive sleep apnea which we will pursue.  I am going to order a 2-week Zio patch to further evaluate. ? ?Obstructive sleep apnea ?Body habitus, nocturnal snoring and daytime somnolence all consistent with obstructive sleep apnea.  We will pursue outpatient sleep study ? ? ? ? ?Lorretta Harp MD FACP,FACC,FAHA, FSCAI ?04/19/2021 ?2:32 PM ?

## 2021-04-19 NOTE — Assessment & Plan Note (Signed)
History of essential hypertension a blood pressure measured today 152/80.  He is on low-dose losartan. ?

## 2021-04-19 NOTE — Assessment & Plan Note (Signed)
Body habitus, nocturnal snoring and daytime somnolence all consistent with obstructive sleep apnea.  We will pursue outpatient sleep study ?

## 2021-04-19 NOTE — Assessment & Plan Note (Signed)
Mr. Goldman was referred to me by Dr. Moreen Fowler for palpitations which began last July.  He has had 15 or so episodes that he has caught on his "apple watch".  They last for seconds at a time.  He is relatively asymptomatic during these.  He has since reduced his caffeine intake.  He is had no episodes over the last 3 months.  His thyroid function test ordered by his PCP were normal.  He does have symptoms compatible with obstructive sleep apnea which we will pursue.  I am going to order a 2-week Zio patch to further evaluate. ?

## 2021-05-05 ENCOUNTER — Ambulatory Visit (HOSPITAL_COMMUNITY): Payer: Medicare Other | Attending: Cardiology

## 2021-05-05 ENCOUNTER — Ambulatory Visit
Admission: RE | Admit: 2021-05-05 | Discharge: 2021-05-05 | Disposition: A | Discharge status: Home / Self Care | Payer: Self-pay | Source: Ambulatory Visit | Attending: Cardiovascular Disease | Admitting: Cardiovascular Disease

## 2021-05-05 DIAGNOSIS — R002 Palpitations: Secondary | ICD-10-CM | POA: Insufficient documentation

## 2021-05-05 DIAGNOSIS — G4733 Obstructive sleep apnea (adult) (pediatric): Secondary | ICD-10-CM

## 2021-05-05 DIAGNOSIS — I10 Essential (primary) hypertension: Secondary | ICD-10-CM | POA: Diagnosis not present

## 2021-05-05 LAB — ECHOCARDIOGRAM COMPLETE
Area-P 1/2: 3.42 cm2
S' Lateral: 3.6 cm

## 2021-05-05 MED ORDER — PERFLUTREN LIPID MICROSPHERE
1.0000 mL | INTRAVENOUS | Status: AC | PRN
  Administered 2021-05-05: 2 mL via INTRAVENOUS

## 2021-05-07 DIAGNOSIS — R002 Palpitations: Secondary | ICD-10-CM | POA: Diagnosis not present

## 2021-05-17 ENCOUNTER — Encounter: Payer: Self-pay | Admitting: Cardiovascular Disease

## 2021-05-17 ENCOUNTER — Ambulatory Visit (INDEPENDENT_AMBULATORY_CARE_PROVIDER_SITE_OTHER): Payer: Medicare Other | Admitting: Cardiovascular Disease

## 2021-05-17 VITALS — BP 140/80 | HR 66 | Ht 71.0 in | Wt 223.0 lb

## 2021-05-17 DIAGNOSIS — G4733 Obstructive sleep apnea (adult) (pediatric): Secondary | ICD-10-CM | POA: Diagnosis not present

## 2021-05-17 DIAGNOSIS — I10 Essential (primary) hypertension: Secondary | ICD-10-CM | POA: Diagnosis not present

## 2021-05-17 DIAGNOSIS — R002 Palpitations: Secondary | ICD-10-CM

## 2021-05-17 DIAGNOSIS — R931 Abnormal findings on diagnostic imaging of heart and coronary circulation: Secondary | ICD-10-CM | POA: Diagnosis not present

## 2021-05-17 MED ORDER — ROSUVASTATIN CALCIUM 20 MG PO TABS: 20.0000 mg | ORAL_TABLET | Freq: Every day | ORAL | 3 refills | Status: DC

## 2021-05-17 NOTE — Progress Notes (Signed)
Donald Norman returns today for follow-up of his outpatient tests performed because of palpitations and hyperlipidemia.  His 2D echo was normal except for grade 1 diastolic dysfunction.  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  He is currently wearing his Zio patch.  He is scheduled for a split outpatient sleep study at [facility] later this month.  A coronary calcium score was measured at 1374 with calcium distributed throughout his 3 coronary arteries although he is completely asymptomatic.  His most recent lipid profile performed 03/28/2021 revealed total cholesterol 197, LDL of 136 and HDL 50.  His LDL goal is less than 70 given his elevated coronary calcium score.  He may have been sensitive to statins in the past.  I am going to start rosuvastatin 20 mg a day and we will recheck a lipid liver profile in 3 months.  I will see him back in 1 year for follow-up.  His twelve-lead EKG today revealed sinus rhythm at 66 without ST or T wave changes. ? ?Lorretta Harp, M.D., [place], [facility], [place], [place] ?[address] ?[place], [place]  [address]  ?[phone] ?05/17/2021 ?8:57 AM  ?

## 2021-05-17 NOTE — Patient Instructions (Signed)
Medication Instructions:  ? ?-Start rosuvastatin (crestor) '20mg'$  once daily. ? ?*If you need a refill on your cardiac medications before your next appointment, please call your pharmacy* ? ? ?Lab Work: ?Your physician recommends that you return for lab work in: 3 months for FASTING lipid/liver profile. ? ?If you have labs (blood work) drawn today and your tests are completely normal, you will receive your results only by: ?MyChart Message (if you have MyChart) OR ?A paper copy in the mail ?If you have any lab test that is abnormal or we need to change your treatment, we will call you to review the results. ? ? ?Testing/Procedures: ?Your physician has requested that you have a lexiscan myoview. For further information please visit [email or web address]. Please follow instruction sheet, as given. This will take place at [place], [address] ? ?How to prepare for your Myocardial Perfusion Test: ?Do not eat or drink 3 hours prior to your test, except you may have water. ?Do not consume products containing caffeine (regular or decaffeinated) 12 hours prior to your test. (ex: coffee, chocolate, sodas, tea). ?Do bring a list of your current medications with you.  If not listed below, you may take your medications as normal. ?Do wear comfortable clothes (no dresses or overalls) and walking shoes, tennis shoes preferred (No heels or open toe shoes are allowed). ?Do NOT wear cologne, perfume, aftershave, or lotions (deodorant is allowed). ?The test will take approximately 3 to 4 hours to complete ?If these instructions are not followed, your test will have to be rescheduled. ? ? ? ?Follow-Up: ?At [facility], you and your health needs are our priority.  As part of our continuing mission to provide you with exceptional heart care, we have created designated Provider Care Teams.  These Care Teams include your primary Cardiologist (physician) and Advanced Practice Providers (APPs -  Physician Assistants and Nurse  Practitioners) who all work together to provide you with the care you need, when you need it. ? ?We recommend signing up for the patient portal called "MyChart".  Sign up information is provided on this After Visit Summary.  MyChart is used to connect with patients for Virtual Visits (Telemedicine).  Patients are able to view lab/test results, encounter notes, upcoming appointments, etc.  Non-urgent messages can be sent to your provider as well.   ?To learn more about what you can do with MyChart, go to [email or web address].   ? ?Your next appointment:   ?12 month(s) ? ?The format for your next appointment:   ?In Person ? ?Provider:   ?Quay Burow, MD ?

## 2021-05-24 ENCOUNTER — Encounter (HOSPITAL_COMMUNITY): Payer: Medicare Other

## 2021-05-25 ENCOUNTER — Telehealth (HOSPITAL_COMMUNITY): Payer: Self-pay | Admitting: *Deleted

## 2021-05-25 NOTE — Telephone Encounter (Signed)
Close encounter 

## 2021-05-26 ENCOUNTER — Ambulatory Visit (HOSPITAL_COMMUNITY)
Admission: RE | Admit: 2021-05-26 | Discharge: 2021-05-26 | Disposition: A | Discharge status: Home / Self Care | Payer: Medicare Other | Source: Ambulatory Visit | Attending: Cardiovascular Disease | Admitting: Cardiovascular Disease

## 2021-05-26 DIAGNOSIS — I10 Essential (primary) hypertension: Secondary | ICD-10-CM | POA: Insufficient documentation

## 2021-05-26 DIAGNOSIS — R931 Abnormal findings on diagnostic imaging of heart and coronary circulation: Secondary | ICD-10-CM | POA: Insufficient documentation

## 2021-05-26 DIAGNOSIS — R002 Palpitations: Secondary | ICD-10-CM | POA: Diagnosis not present

## 2021-05-26 DIAGNOSIS — Z79899 Other long term (current) drug therapy: Secondary | ICD-10-CM | POA: Insufficient documentation

## 2021-05-26 DIAGNOSIS — G4733 Obstructive sleep apnea (adult) (pediatric): Secondary | ICD-10-CM | POA: Insufficient documentation

## 2021-05-26 LAB — MYOCARDIAL PERFUSION IMAGING
LV dias vol: 134 mL (ref 62–150)
LV sys vol: 58 mL
Nuc Stress EF: 56 %
Peak HR: 84 {beats}/min
Rest HR: 61 {beats}/min
Rest Nuclear Isotope Dose: 11 mCi
SDS: 0
SRS: 8
SSS: 8
ST Depression (mm): 0 mm
Stress Nuclear Isotope Dose: 30.6 mCi
TID: 1.48

## 2021-05-26 MED ORDER — TECHNETIUM TC 99M TETROFOSMIN IV KIT
30.6000 | PACK | Freq: Once | INTRAVENOUS | Status: AC | PRN
  Administered 2021-05-26: 30.6 via INTRAVENOUS

## 2021-05-26 MED ORDER — TECHNETIUM TC 99M TETROFOSMIN IV KIT
11.0000 | PACK | Freq: Once | INTRAVENOUS | Status: AC | PRN
  Administered 2021-05-26: 11 via INTRAVENOUS

## 2021-05-26 MED ORDER — REGADENOSON 0.4 MG/5ML IV SOLN
0.4000 mg | Freq: Once | INTRAVENOUS | Status: AC
  Administered 2021-05-26: 0.4 mg via INTRAVENOUS

## 2021-05-31 ENCOUNTER — Ambulatory Visit (HOSPITAL_BASED_OUTPATIENT_CLINIC_OR_DEPARTMENT_OTHER): Payer: Medicare Other | Attending: Cardiovascular Disease | Admitting: Cardiovascular Disease

## 2021-05-31 DIAGNOSIS — I10 Essential (primary) hypertension: Secondary | ICD-10-CM | POA: Diagnosis not present

## 2021-05-31 DIAGNOSIS — G478 Other sleep disorders: Secondary | ICD-10-CM | POA: Diagnosis not present

## 2021-05-31 DIAGNOSIS — G4736 Sleep related hypoventilation in conditions classified elsewhere: Secondary | ICD-10-CM | POA: Insufficient documentation

## 2021-05-31 DIAGNOSIS — R002 Palpitations: Secondary | ICD-10-CM

## 2021-05-31 DIAGNOSIS — I493 Ventricular premature depolarization: Secondary | ICD-10-CM | POA: Diagnosis not present

## 2021-05-31 DIAGNOSIS — G4733 Obstructive sleep apnea (adult) (pediatric): Secondary | ICD-10-CM

## 2021-06-03 ENCOUNTER — Inpatient Hospital Stay: Admission: RE | Admit: 2021-06-03 | Payer: Medicare Other | Source: Ambulatory Visit

## 2021-06-08 ENCOUNTER — Ambulatory Visit: Payer: Medicare Other | Admitting: Cardiovascular Disease

## 2021-06-08 ENCOUNTER — Encounter: Payer: Self-pay | Admitting: Cardiovascular Disease

## 2021-06-08 VITALS — BP 125/76 | HR 71 | Ht 72.0 in | Wt 210.8 lb

## 2021-06-08 DIAGNOSIS — I480 Paroxysmal atrial fibrillation: Secondary | ICD-10-CM

## 2021-06-08 MED ORDER — METOPROLOL SUCCINATE ER 25 MG PO TB24: 12.5000 mg | ORAL_TABLET | Freq: Every day | ORAL | 3 refills | Status: DC

## 2021-06-08 MED ORDER — APIXABAN 5 MG PO TABS: 5.0000 mg | ORAL_TABLET | Freq: Two times a day (BID) | ORAL | 0 refills | Status: DC

## 2021-06-08 MED ORDER — APIXABAN 5 MG PO TABS
5.0000 mg | ORAL_TABLET | Freq: Two times a day (BID) | ORAL | 3 refills | Status: DC
Start: 2021-06-08 — End: 2022-06-26

## 2021-06-08 NOTE — Patient Instructions (Signed)
Medication Instructions:   -Start apixaban (eliquis) '5mg'$  twice daily.  -Start metoprolol succinate (toprol-xl) 12.'5mg'$  once daily.  *If you need a refill on your cardiac medications before your next appointment, please call your pharmacy*   Lab Work: Your physician recommends that you return for lab work in: 2 months for FASTING lipid/liver profile.  If you have labs (blood work) drawn today and your tests are completely normal, you will receive your results only by: [place] (if you have MyChart) OR A paper copy in the mail If you have any lab test that is abnormal or we need to change your treatment, we will call you to review the results.   Follow-Up: At [facility], you and your health needs are our priority.  As part of our continuing mission to provide you with exceptional heart care, we have created designated Provider Care Teams.  These Care Teams include your primary Cardiologist (physician) and Advanced Practice Providers (APPs -  Physician Assistants and Nurse Practitioners) who all work together to provide you with the care you need, when you need it.  We recommend signing up for the patient portal called "MyChart".  Sign up information is provided on this After Visit Summary.  MyChart is used to connect with patients for Virtual Visits (Telemedicine).  Patients are able to view lab/test results, encounter notes, upcoming appointments, etc.  Non-urgent messages can be sent to your provider as well.   To learn more about what you can do with MyChart, go to [email or web address].    Your next appointment:   3 month(s)  The format for your next appointment:   In Person  Provider:   Quay Burow, MD

## 2021-06-08 NOTE — Progress Notes (Signed)
Donald Norman returns today for follow-up of his diagnostic studies.  He did have a coronary calcium score of 1374.  A subsequent Myoview stress test showed diaphragmatic attenuation and 2D echo was normal.  He is completely asymptomatic with regards to symptoms of ischemic heart disease.  I did place an event monitor on him that showed frequent PVCs, runs of PAF, SVT and nonsustained ventricular tachycardia.  I am going to begin him on a low-dose beta-blocker and Eliquis and refer him to the A-fib clinic for further evaluation.  He also had a sleep study performed which has yet to be read.  We talked about cardiac catheterization but I am going to defer this until he developed symptoms which sound anginal.  I will see him back in 3 months for follow-up.  Lorretta Harp, M.D., [place], [facility], [place] [address] [place], [place]  [address]  [phone] 06/08/2021 4:26 PM

## 2021-06-09 NOTE — Procedures (Signed)
Patient Name: Donald Norman, Donald Norman Date: 05/31/2021 Gender: Male D.O.B: 1950-05-20 Age (years): 57 Referring Provider: Lorretta Harp Height (inches): 72 Interpreting Physician: Shelva Majestic MD, ABSM Weight (lbs): 215 RPSGT: Gwenyth Allegra BMI: 29 MRN: [id number] Neck Size: 16.00  CLINICAL INFORMATION Sleep Study Type: NPSG  Indication for sleep study: Hypertension  Epworth Sleepiness Score: 3  SLEEP STUDY TECHNIQUE As per the AASM Manual for the Scoring of Sleep and Associated Events v2.3 (April 2016) with a hypopnea requiring 4% desaturations.  The channels recorded and monitored were frontal, central and occipital EEG, electrooculogram (EOG), submentalis EMG (chin), nasal and oral airflow, thoracic and abdominal wall motion, anterior tibialis EMG, snore microphone, electrocardiogram, and pulse oximetry.  MEDICATIONS apixaban (ELIQUIS) 5 MG TABS tablet aspirin 81 MG EC tablet diclofenac Sodium (VOLTAREN) 1 % GEL fluticasone (FLONASE) 50 MCG/ACT nasal spray Ibuprofen 200 MG CAPS levocetirizine (XYZAL) 5 MG tablet losartan (COZAAR) 25 MG tablet metoprolol succinate (TOPROL-XL) 25 MG 24 hr tablet Multiple Vitamin (MULTI-VITAMIN DAILY) TABS Polyethyl Glycol-Propyl Glycol (SYSTANE) 0.4-0.3 % SOLN rosuvastatin (CRESTOR) 20 MG tablet sertraline (ZOLOFT) 25 MG tablet testosterone (ANDROGEL) 50 MG/5GM (1%) GEL Medications self-administered by patient taken the night of the study : N/A  SLEEP ARCHITECTURE The study was initiated at 10:55:58 PM and ended at 5:03:12 AM.  Sleep onset time was 29.1 minutes and the sleep efficiency was 58.3%%. The total sleep time was 214.1 minutes.  Stage REM latency was 225.5 minutes.  The patient spent 9.1%% of the night in stage N1 sleep, 90.2%% in stage N2 sleep, 0.0%% in stage N3 and 0.7% in REM.  Alpha intrusion was absent.  Supine sleep was 21.95%.  RESPIRATORY PARAMETERS The overall apnea/hypopnea index (AHI) was 2.5  per hour. The respiratory disturbance index (RDI) was 10.4/h.There were 0 total apneas, including 0 obstructive, 0 central and 0 mixed apneas. There were 9 hypopneas and 28 RERAs.  The AHI during Stage REM sleep was 0.0 per hour.  AHI while supine was 10.2 per hour.  The mean oxygen saturation was 91.5%. The minimum SpO2 during sleep was 87.0%.  Moderate snoring was noted during this study.  CARDIAC DATA The 2 lead EKG demonstrated sinus rhythm. The mean heart rate was 55.5 beats per minute. Other EKG findings include: None.  LEG MOVEMENT DATA The total PLMS were 0 with a resulting PLMS index of 0.0. Associated arousal with leg movement index was 0.0 .  IMPRESSIONS - Increased upper airway resistance without significant obstructive sleep apnea (AHI 2.5/h) - Mild oxygen desaturation to a nadir of 87%. - The patient snored with moderate snoring volume. - Abnormal sleep architecture with absent slow wave and very minimal REM sleep at 0.7%. - No significant cardiac abnormalities were noted during this study; rare PVC - Clinically significant periodic limb movements did not occur during sleep. No significant associated arousals.  DIAGNOSIS - Nocturnal Hypoxemia (G47.36) - Upper Airway Resistance (UARS)  RECOMMENDATIONS - At present there is no indication for CPAP. - Effort should be made to optimize nasal and orophayngeal patency. - Consider alternatives for the treatment of moderate snoring - Avoid alcohol, sedatives and other CNS depressants that may worsen sleep apnea and disrupt normal sleep architecture. - Sleep hygiene should be reviewed to assess factors that may improve sleep quality. - Weight management and regular exercise should be initiated or continued if appropriate.  [Electronically signed] 06/09/2021 09:12 PM  Shelva Majestic MD, [facility], ABSM Diplomate, American Board of Sleep Medicine  NPI: [id number]  [place] PH: [phone]   FX:  [phone] [place]

## 2021-06-16 ENCOUNTER — Telehealth: Payer: Self-pay | Admitting: *Deleted

## 2021-06-16 NOTE — Telephone Encounter (Signed)
Message left to return a call to discuss NSPG results and recommendations or he can discuss with Roderic Palau at his 06/20/21 appointment.

## 2021-06-16 NOTE — Telephone Encounter (Signed)
-----   Message from Troy Sine, MD sent at 06/09/2021  9:17 PM EDT ----- Mariann Laster please notify patient the results of the study.  At present there is no indication for CPAP.  He has increased upper airway resistance syndrome and moderate snoring.  There was absent slow-wave sleep and minimal if any REM sleep.

## 2021-06-20 ENCOUNTER — Ambulatory Visit (HOSPITAL_COMMUNITY)
Admission: RE | Admit: 2021-06-20 | Discharge: 2021-06-20 | Disposition: A | Discharge status: Home / Self Care | Payer: Medicare Other | Source: Ambulatory Visit | Attending: Nurse Practitioner | Admitting: Nurse Practitioner

## 2021-06-20 ENCOUNTER — Encounter (HOSPITAL_COMMUNITY): Payer: Self-pay | Admitting: Nurse Practitioner

## 2021-06-20 VITALS — BP 128/76 | HR 58 | Ht 72.0 in | Wt 208.4 lb

## 2021-06-20 DIAGNOSIS — I10 Essential (primary) hypertension: Secondary | ICD-10-CM | POA: Diagnosis not present

## 2021-06-20 DIAGNOSIS — Z7982 Long term (current) use of aspirin: Secondary | ICD-10-CM | POA: Insufficient documentation

## 2021-06-20 DIAGNOSIS — Z7901 Long term (current) use of anticoagulants: Secondary | ICD-10-CM | POA: Insufficient documentation

## 2021-06-20 DIAGNOSIS — I471 Supraventricular tachycardia: Secondary | ICD-10-CM | POA: Diagnosis not present

## 2021-06-20 DIAGNOSIS — Z79899 Other long term (current) drug therapy: Secondary | ICD-10-CM | POA: Insufficient documentation

## 2021-06-20 DIAGNOSIS — E785 Hyperlipidemia, unspecified: Secondary | ICD-10-CM | POA: Insufficient documentation

## 2021-06-20 DIAGNOSIS — R002 Palpitations: Secondary | ICD-10-CM | POA: Insufficient documentation

## 2021-06-20 DIAGNOSIS — I4891 Unspecified atrial fibrillation: Secondary | ICD-10-CM | POA: Diagnosis not present

## 2021-06-20 NOTE — Progress Notes (Signed)
Primary Care Physician: Antony Contras, MD Referring Physician: Dr. Alfred Levins Donald Norman. is a 71 y.o. male with a h/o  hypertension, hyperlipidemia.  He noticed the onset of tachypalpitations July 2022.  He had 15 episodes which she is captured on his "apple watch" which last for seconds to minutes at a time and are self-limited without symptoms.  He has decreased his caffeine intake and has not had any symptoms in the last 3 months.  The pt is in the afib clinic as Dr. Gwenlyn Found had pt wear a monitor for palpitations that showed 24 v tach runs, fastest interval 5 beats with max rate of 218 bpm, 225 SVT runs with the fastest rate of 210 bpm, longest lasting 17.8 secs, some of the SVT may be atach,with variable block,  afib with less than 1% afib burden. Longest lasting 18 mins. Ventricular bigeminy and trigeminy were noted.   He had a subsequent myoview stress test and a coronary calcium score of 1374. The stress test was intermediate risk but thought to not show any ischemia.   and 2D echo was normal. He is currently not having any ischemic symptoms and Dr. Gwenlyn Found placed him on low dose BB and eliquis and referred here. With lack of anginal symptoms, a LHC was deferred.   Pt states that he is not tolerating the BB well with feelings of fatigue and being lightheaded. He is taking eliquis 5 mg bid for a CHA2DS2VASc  score of 2 (1 for age over 52 and 1 for HTN). He is active with  bike riding, prior sleep study in May did not any sleep apnea and no indication for cpap. No alcohol or tobacco use. Minimal caffeine use.    Today, he denies symptoms of palpitations, chest pain, shortness of breath, orthopnea, PND, lower extremity edema, dizziness, presyncope, syncope, or neurologic sequela. The patient is tolerating medications without difficulties and is otherwise without complaint today.   Past Medical History:  Diagnosis Date   Allergy    Anemia    Basal cell cancer    Cancer (HCC)    Depression     Heart murmur    Hypertension    PVC (premature ventricular contraction)    Tendonitis    Past Surgical History:  Procedure Laterality Date   CYSTOSCOPY     EYE SURGERY  lasik and retina repair   HERNIA REPAIR     TONSILLECTOMY AND ADENOIDECTOMY      Current Outpatient Medications  Medication Sig Dispense Refill   apixaban (ELIQUIS) 5 MG TABS tablet Take 1 tablet (5 mg total) by mouth 2 (two) times daily. 60 tablet 0   apixaban (ELIQUIS) 5 MG TABS tablet Take 1 tablet (5 mg total) by mouth 2 (two) times daily. 180 tablet 3   aspirin 81 MG EC tablet      diclofenac Sodium (VOLTAREN) 1 % GEL      fluticasone (FLONASE) 50 MCG/ACT nasal spray Place into the nose.     Ibuprofen 200 MG CAPS      levocetirizine (XYZAL) 5 MG tablet      losartan (COZAAR) 25 MG tablet Take 1 tablet (25 mg total) by mouth daily. 90 tablet 1   metoprolol succinate (TOPROL-XL) 25 MG 24 hr tablet Take 0.5 tablets (12.5 mg total) by mouth daily. Take with or immediately following a meal. 45 tablet 3   Multiple Vitamin (MULTI-VITAMIN DAILY) TABS      Polyethyl Glycol-Propyl Glycol (SYSTANE) 0.4-0.3 % SOLN  rosuvastatin (CRESTOR) 20 MG tablet Take 1 tablet (20 mg total) by mouth daily. 90 tablet 3   sertraline (ZOLOFT) 25 MG tablet Take 1 tablet (25 mg total) by mouth daily. 90 tablet 1   testosterone (ANDROGEL) 50 MG/5GM (1%) GEL 1/2 tube     No current facility-administered medications for this encounter.    No Known Allergies  Social History   Socioeconomic History   Marital status: Married    Spouse name: Not on file   Number of children: Not on file   Years of education: college   Highest education level: Not on file  Occupational History   Occupation: Scientist, research (physical sciences)  Tobacco Use   Smoking status: Never   Smokeless tobacco: Never  Substance and Sexual Activity   Alcohol use: No    Alcohol/week: 2.0 standard drinks of alcohol    Types: 2 Standard drinks or equivalent per week   Drug use: No    Sexual activity: Yes    Partners: Female  Other Topics Concern   Not on file  Social History Narrative   Not on file   Social Determinants of Health   Financial Resource Strain: Not on file  Food Insecurity: Not on file  Transportation Needs: Not on file  Physical Activity: Not on file  Stress: Not on file  Social Connections: Not on file  Intimate Partner Violence: Not on file    Family History  Problem Relation Age of Onset   Heart disease Mother    Hypertension Mother    Diabetes Father    Heart disease Father    Hypertension Sister    Anemia Sister    Heart disease Maternal Grandmother    Diabetes Paternal Grandmother     ROS- All systems are reviewed and negative except as per the HPI above  Physical Exam: Vitals:   06/20/21 0831  Height: 6' (1.829 m)   Wt Readings from Last 3 Encounters:  06/08/21 95.6 kg  05/31/21 97.5 kg  05/26/21 101.2 kg    Labs: Lab Results  Component Value Date   NA 137 10/06/2014   K 4.3 10/06/2014   CL 102 10/06/2014   CO2 27 10/06/2014   GLUCOSE 101 (H) 10/06/2014   BUN 22 10/06/2014   CREATININE 0.88 10/06/2014   CALCIUM 9.2 10/06/2014   No results found for: "INR" Lab Results  Component Value Date   CHOL 219 (H) 10/06/2014   HDL 57 10/06/2014   LDLCALC 143 (H) 10/06/2014   TRIG 97 10/06/2014     GEN- The patient is well appearing, alert and oriented x 3 today.   Head- normocephalic, atraumatic Eyes-  Sclera clear, conjunctiva pink Ears- hearing intact Oropharynx- clear Neck- supple, no JVP Lymph- no cervical lymphadenopathy Lungs- Clear to ausculation bilaterally, normal work of breathing Heart- Regular rate and rhythm, no murmurs, rubs or gallops, PMI not laterally displaced GI- soft, NT, ND, + BS Extremities- no clubbing, cyanosis, or edema MS- no significant deformity or atrophy Skin- no rash or lesion Psych- euthymic mood, full affect Neuro- strength and sensation are intact  EKG-Vent. rate 58  BPM PR interval 168 ms QRS duration 86 ms QT/QTcB 402/394 ms P-R-T axes 53 1 56 Sinus bradycardia Otherwise normal ECG When compared with ECG of 04-Sep-2003 06:07, PREVIOUS ECG IS PRESENT  Echo-FINDINGS   Left Ventricle: Left ventricular ejection fraction, by estimation, is 60  to 65%. The left ventricle has normal function. The left ventricle has no  regional wall motion abnormalities. Definity contrast  agent was given IV  to delineate the left ventricular   endocardial borders. The left ventricular internal cavity size was normal  in size. There is no left ventricular hypertrophy. Left ventricular  diastolic parameters are consistent with Grade I diastolic dysfunction  (impaired relaxation). Normal left  ventricular filling pressure.   Stress myoview-  Findings are equivocal. The study is intermediate risk.   No ST deviation was noted.   LV perfusion is equivocal. There is no evidence of ischemia.   Left ventricular function is normal. End diastolic cavity size is mildly enlarged. End systolic cavity size is normal.   Prior study not available for comparison.  Monitor-   Patient had a min HR of 45 bpm, max HR of 218 bpm, and avg HR of 75 bpm. Predominant underlying rhythm was Sinus Rhythm. 24 Ventricular Tachycardia runs occurred, the run with the fastest interval lasting 5 beats with a max rate of 218 bpm, the longest  lasting 7 beats with an avg rate of 179 bpm. 225 Supraventricular Tachycardia runs occurred, the run with the fastest interval lasting 9 beats with a max rate of 210 bpm, the longest lasting 17.8 secs with an avg rate of 118 bpm. Episode of  Supraventricular Tachycardia may be possible Atrial Tachycardia with variable block. Atrial Fibrillation occurred (<1% burden), ranging from 84-146 bpm (avg of 108 bpm), the longest lasting 18 mins 56 secs with an avg rate of 109 bpm. Supraventricular  Tachycardia was detected within +/- 45 seconds of symptomatic patient event(s).  Isolated SVEs were rare (<1.0%), SVE Couplets were rare (<1.0%), and SVE Triplets were rare (<1.0%). Isolated VEs were occasional (3.3%, 49370), VE Couplets were rare (<1.0%,  1337), and VE Triplets were rare (<1.0%, 131). Ventricular Bigeminy and Trigeminy were present.   1. SR/SB/ST 2. Occasional PACs, more freq PVCs 3. Runs of PAF 4. Runs of SVT and NSVT 5. Needs ROV to discuss          Assessment and Plan:  1. Palpitations Present for over one year Pt not really symptomatic with this but his apple watch alerts him Recent  monitor showed mainly  SVT and V tach events with less than 1 % afib burden Pt has been placed on low dose Toprol, 1/2 tab at hs, that he is symptomatic  with fatigue and lightheadedness and remembers he took years ago and stopped for same reasons  No outstanding lifestyle features that would be contributing He has a high calcium score and intermediate stress test but no ischemia and without any symptoms of angina, Dr. Gwenlyn Found deferred [place] Echo normal  He states PCP recently checked TSH and wnl  Since his other tachyrhythmia 's are significantly more of an issue  on his monitor  than less than 1% afib burden will refer to EP to further assess monitor and means to treat since he is not tolerating BB Also would like to see if Eliquis is truly needed long term with such low afib burden, CHA2DS2VASc  score of 2 with no prior stroke  He is currently taking asa and turmeric as well that will increase risk of bleeding I have asked him to hold these while taking eliquis.     2. HTN Stable  3. HLD Calcium score over 1300 Taking Crestor 20 mg daily   Referred to Dr. Lovena Le for consult  to further discuss abnormal Holter results   Butch Penny C. Lafe Clerk, [facility] [address] [place], [place] [address] [phone]

## 2021-06-23 ENCOUNTER — Ambulatory Visit: Payer: Medicare Other | Admitting: Internal Medicine

## 2021-06-23 ENCOUNTER — Encounter: Payer: Self-pay | Admitting: Internal Medicine

## 2021-06-23 VITALS — BP 128/82 | HR 74 | Ht 72.0 in | Wt 210.0 lb

## 2021-06-23 DIAGNOSIS — R002 Palpitations: Secondary | ICD-10-CM

## 2021-06-23 DIAGNOSIS — I4719 Other supraventricular tachycardia: Secondary | ICD-10-CM

## 2021-06-23 DIAGNOSIS — I471 Supraventricular tachycardia: Secondary | ICD-10-CM | POA: Diagnosis not present

## 2021-06-23 MED ORDER — FLECAINIDE ACETATE 50 MG PO TABS: 75.0000 mg | ORAL_TABLET | Freq: Two times a day (BID) | ORAL | 11 refills | Status: DC

## 2021-06-23 NOTE — Patient Instructions (Addendum)
Medication Instructions:  Your physician has recommended you make the following change in your medication:    START taking flecainide 50 mg-  Take 1.5 tablets (75 mg) by mouth twice a day  Labwork: None ordered.  Testing/Procedures: None ordered.  Follow-Up:  You will have a 2 week follow up for nurse visit for an EKG.  Your physician wants you to follow-up in: 3 months with Cristopher Peru, MD.   Any Other Special Instructions Will Be Listed Below (If Applicable).  If you need a refill on your cardiac medications before your next appointment, please call your pharmacy.   Flecainide Tablets What is this medication? FLECAINIDE (FLEK a nide) prevents and treats a fast or irregular heartbeat (arrhythmia). It is often used to treat a type of arrhythmia known as AFib (atrial fibrillation). It works by slowing down overactive electric signals in the heart, which stabilizes your heart rhythm. It belongs to a group of medications called antiarrhythmics. This medicine may be used for other purposes; ask your health care provider or pharmacist if you have questions. COMMON BRAND NAME(S): Tambocor What should I tell my care team before I take this medication? They need to know if you have any of these conditions: Abnormal levels of potassium in the blood Heart disease including heart rhythm and heart rate problems Kidney or liver disease Recent heart attack An unusual or allergic reaction to flecainide, local anesthetics, other medications, foods, dyes, or preservatives Pregnant or trying to get pregnant Breast-feeding How should I use this medication? Take this medication by mouth with a glass of water. Follow the directions on the prescription label. You can take this medication with or without food. Take your doses at regular intervals. Do not take your medication more often than directed. Do not stop taking this medication suddenly. This may cause serious, heart-related side effects. If  your care team wants you to stop the medication, the dose may be slowly lowered over time to avoid any side effects. Talk to your care team regarding the use of this medication in children. While this medication may be prescribed for children as young as 1 year of age for selected conditions, precautions do apply. Overdosage: If you think you have taken too much of this medicine contact a poison control center or emergency room at once. NOTE: This medicine is only for you. Do not share this medicine with others. What if I miss a dose? If you miss a dose, take it as soon as you can. If it is almost time for your next dose, take only that dose. Do not take double or extra doses. What may interact with this medication? Do not take this medication with any of the following: Amoxapine Arsenic trioxide Certain antibiotics like clarithromycin, erythromycin, gatifloxacin, gemifloxacin, levofloxacin, moxifloxacin, sparfloxacin, or troleandomycin Certain antidepressants called tricyclic antidepressants like amitriptyline, imipramine, or nortriptyline Certain medications to control heart rhythm like disopyramide, encainide, moricizine, procainamide, propafenone, and quinidine Cisapride Delavirdine Droperidol Haloperidol [place] Imatinib Levomethadyl Maprotiline Medications for malaria like chloroquine and halofantrine Pentamidine Phenothiazines like chlorpromazine, mesoridazine, prochlorperazine, thioridazine Pimozide Quinine Ranolazine Ritonavir Sertindole This medication may also interact with the following: Cimetidine Dofetilide Medications for angina or high blood pressure Medications to control heart rhythm like amiodarone and digoxin Ziprasidone This list may not describe all possible interactions. Give your health care provider a list of all the medicines, herbs, non-prescription drugs, or dietary supplements you use. Also tell them if you smoke, drink alcohol, or use illegal drugs.  Some items may  interact with your medicine. What should I watch for while using this medication? Visit your care team for regular checks on your progress. Because your condition and the use of this medication carries some risk, it is a good idea to carry an identification card, necklace or bracelet with details of your condition, medications, and care team. Check your blood pressure and pulse rate regularly. Ask your care team what your blood pressure and pulse rate should be, and when you should contact them. Your care team also may schedule regular blood tests and electrocardiograms to check your progress. You may get drowsy or dizzy. Do not drive, use machinery, or do anything that needs mental alertness until you know how this medication affects you. Do not stand or sit up quickly, especially if you are an older patient. This reduces the risk of dizzy or fainting spells. Alcohol can make you more dizzy, increase flushing and rapid heartbeats. Avoid alcoholic drinks. What side effects may I notice from receiving this medication? Side effects that you should report to your care team as soon as possible: Allergic reactions--skin rash, itching, hives, swelling of the face, lips, tongue, or throat Heart failure--shortness of breath, swelling of the ankles, feet, or hands, sudden weight gain, unusual weakness or fatigue Heart rhythm changes--fast or irregular heartbeat, dizziness, feeling faint or lightheaded, chest pain, trouble breathing Liver injury--right upper belly pain, loss of appetite, nausea, light-colored stool, dark yellow or brown urine, yellowing skin or eyes, unusual weakness or fatigue Side effects that usually do not require medical attention (report to your care team if they continue or are bothersome): Blurry vision Constipation Dizziness Fatigue Headache Nausea Tremors or shaking This list may not describe all possible side effects. Call your doctor for medical advice about side  effects. You may report side effects to FDA at 1-800-FDA-1088. Where should I keep my medication? Keep out of the reach of children and pets. Store at room temperature between 15 and 30 degrees C (59 and 86 degrees F). Protect from light. Keep container tightly closed. Throw away any unused medication after the expiration date. NOTE: This sheet is a summary. It may not cover all possible information. If you have questions about this medicine, talk to your doctor, pharmacist, or health care provider.  2023 Elsevier/Gold Standard (2020-02-13 00:00:00)

## 2021-06-23 NOTE — Progress Notes (Signed)
HPI [name] is referred by Dr. Gwenlyn Found for evaluation of palpitations and NS SVT and NS VT. He is a very pleasant 71 yo man with palpitations who was found on cardiac monitoring to have NS SVT and NS VT. He has not had syncope. He denies chest pain or any anginal symptoms. He has minimal dyspnea with exertion. No edema. He has a calcium score of over 1300.  No Known Allergies   Current Outpatient Medications  Medication Sig Dispense Refill   apixaban (ELIQUIS) 5 MG TABS tablet Take 1 tablet (5 mg total) by mouth 2 (two) times daily. 180 tablet 3   Coenzyme Q10 (CO Q-10) 100 MG CAPS Take 1 tablet by mouth every morning.     diclofenac Sodium (VOLTAREN) 1 % GEL Apply topically as needed.     flecainide (TAMBOCOR) 50 MG tablet Take 1.5 tablets (75 mg total) by mouth 2 (two) times daily. 90 tablet 11   fluticasone (FLONASE) 50 MCG/ACT nasal spray Place 1 spray into both nostrils daily.     Ibuprofen 200 MG CAPS Take 400 mg by mouth every other day.     levocetirizine (XYZAL) 5 MG tablet      losartan (COZAAR) 25 MG tablet Take 1 tablet (25 mg total) by mouth daily. 90 tablet 1   metoprolol succinate (TOPROL-XL) 25 MG 24 hr tablet Take 0.5 tablets (12.5 mg total) by mouth daily. Take with or immediately following a meal. 45 tablet 3   Multiple Vitamin (MULTI-VITAMIN DAILY) TABS Take 1 tablet by mouth every morning.     Polyethyl Glycol-Propyl Glycol (SYSTANE) 0.4-0.3 % SOLN Place 1 drop into both eyes as needed.     rosuvastatin (CRESTOR) 20 MG tablet Take 1 tablet (20 mg total) by mouth daily. 90 tablet 3   sertraline (ZOLOFT) 25 MG tablet Take 1 tablet (25 mg total) by mouth daily. 90 tablet 1   testosterone (ANDROGEL) 50 MG/5GM (1%) GEL Place onto the skin daily.     No current facility-administered medications for this visit.     Past Medical History:  Diagnosis Date   Allergy    Anemia    Basal cell cancer    Cancer (HCC)    Depression    Heart murmur    Hypertension     PVC (premature ventricular contraction)    Tendonitis     ROS:   All systems reviewed and negative except as noted in the HPI.   Past Surgical History:  Procedure Laterality Date   CYSTOSCOPY     EYE SURGERY  lasik and retina repair   HERNIA REPAIR     TONSILLECTOMY AND ADENOIDECTOMY       Family History  Problem Relation Age of Onset   Heart disease Mother    Hypertension Mother    Diabetes Father    Heart disease Father    Hypertension Sister    Anemia Sister    Heart disease Maternal Grandmother    Diabetes Paternal Grandmother      Social History   Socioeconomic History   Marital status: Married    Spouse name: Not on file   Number of children: Not on file   Years of education: college   Highest education level: Not on file  Occupational History   Occupation: Scientist, research (physical sciences)  Tobacco Use   Smoking status: Never   Smokeless tobacco: Never  Substance and Sexual Activity   Alcohol use: No    Alcohol/week: 2.0 standard drinks of  alcohol    Types: 2 Standard drinks or equivalent per week   Drug use: No   Sexual activity: Yes    Partners: Female  Other Topics Concern   Not on file  Social History Narrative   Not on file   Social Determinants of Health   Financial Resource Strain: Not on file  Food Insecurity: Not on file  Transportation Needs: Not on file  Physical Activity: Not on file  Stress: Not on file  Social Connections: Not on file  Intimate Partner Violence: Not on file     BP 128/82   Pulse 74   Ht 6' (1.829 m)   Wt 210 lb (95.3 kg)   SpO2 96%   BMI 28.48 kg/m   Physical Exam:  Well appearing NAD HEENT: Unremarkable Neck:  No JVD, no thyromegally Lymphatics:  No adenopathy Back:  No CVA tenderness Lungs:  Clear with no wheezes HEART:  Regular rate rhythm, no murmurs, no rubs, no clicks Abd:  soft, positive bowel sounds, no organomegally, no rebound, no guarding Ext:  2 plus pulses, no edema, no cyanosis, no clubbing Skin:  No  rashes no nodules Neuro:  CN II through XII intact, motor grossly intact  EKG - reviewed. NSR  Zio monitor - NSR with PAC's, PVC's, NS SVT, NS VT   Assess/Plan:  NS AT - I have discussed the treatment options with the patient and recommended initiation of flecainide as he is quite symptomatic. He will return in 2 weeks for an ECG and with me in 3 months. He will take 75 mg bid to start. HTN - he will continue his cozaar and low dose toprol. PAF - He caries this diagnosis. The flecainide will help. I think it very likely he will develop more atrial fib. Coags - he has not had any bleeding on systemic anti-coagulation.   Carleene Overlie Corretta Munce,MD

## 2021-07-04 ENCOUNTER — Ambulatory Visit (INDEPENDENT_AMBULATORY_CARE_PROVIDER_SITE_OTHER): Payer: Medicare Other

## 2021-07-04 VITALS — BP 143/81 | HR 64 | Ht 72.0 in | Wt 208.0 lb

## 2021-07-04 DIAGNOSIS — Z79899 Other long term (current) drug therapy: Secondary | ICD-10-CM | POA: Diagnosis not present

## 2021-07-04 DIAGNOSIS — I493 Ventricular premature depolarization: Secondary | ICD-10-CM

## 2021-07-04 DIAGNOSIS — I471 Supraventricular tachycardia: Secondary | ICD-10-CM

## 2021-07-04 NOTE — Progress Notes (Signed)
   Nurse Visit   Date of Encounter: 07/04/2021 ID: [name]., DOB [birth year], MRN [id number]  PCP:  Antony Contras, MD   [facility] Providers EP Cardiologist:  Cristopher Peru, MD    Visit Details   VS:  BP (!) 143/81   Pulse 64   Ht 6' (1.829 m)   Wt 208 lb (94.3 kg)   BMI 28.21 kg/m  , BMI Body mass index is 28.21 kg/m.  Wt Readings from Last 3 Encounters:  07/04/21 208 lb (94.3 kg)  06/23/21 210 lb (95.3 kg)  06/20/21 208 lb 6.4 oz (94.5 kg)     Reason for visit: EKG for Flecainide start Performed today: Vitals, EKG, Consulted with provider Changes (medications, testing, etc.) : No changes recommended  Length of Visit: 25 minutes    Medications Adjustments/Labs and Tests Ordered: Orders Placed This Encounter  Procedures   EKG 12-Lead   No orders of the defined types were placed in this encounter.  Pt states he is tolerating Flecainide without problems and is less aware of his "heart pounding."  Pt advised per DOD, Dr Ali Lowe to continue Flecainide as prescribed and keep current follow up appointment with Dr Lovena Le.  Pt verbalizes understanding and agrees with current plan.  Signed, Thora Lance, RN  07/04/2021 3:36 PM

## 2021-07-04 NOTE — Patient Instructions (Signed)
Medication Instructions:  Your physician recommends that you continue on your current medications as directed. Please refer to the Current Medication list given to you today.  *If you need a refill on your cardiac medications before your next appointment, please call your pharmacy*   Lab Work: None ordered.  If you have labs (blood work) drawn today and your tests are completely normal, you will receive your results only by: [place] (if you have MyChart) OR A paper copy in the mail If you have any lab test that is abnormal or we need to change your treatment, we will call you to review the results.   Testing/Procedures: None ordered.    Follow-Up: At [facility], you and your health needs are our priority.  As part of our continuing mission to provide you with exceptional heart care, we have created designated Provider Care Teams.  These Care Teams include your primary Cardiologist (physician) and Advanced Practice Providers (APPs -  Physician Assistants and Nurse Practitioners) who all work together to provide you with the care you need, when you need it.  We recommend signing up for the patient portal called "MyChart".  Sign up information is provided on this After Visit Summary.  MyChart is used to connect with patients for Virtual Visits (Telemedicine).  Patients are able to view lab/test results, encounter notes, upcoming appointments, etc.  Non-urgent messages can be sent to your provider as well.   To learn more about what you can do with MyChart, go to [email or web address].    Your next appointment:   Follow up with Dr Lovena Le as scheduled  Important Information About Sugar

## 2021-08-11 ENCOUNTER — Encounter: Payer: Self-pay | Admitting: Cardiovascular Disease

## 2021-08-24 DIAGNOSIS — I480 Paroxysmal atrial fibrillation: Secondary | ICD-10-CM | POA: Diagnosis not present

## 2021-08-24 DIAGNOSIS — E785 Hyperlipidemia, unspecified: Secondary | ICD-10-CM | POA: Diagnosis not present

## 2021-08-25 LAB — LIPID PANEL
Chol/HDL Ratio: 2.7 ratio (ref 0.0–5.0)
Cholesterol, Total: 129 mg/dL (ref 100–199)
HDL: 48 mg/dL (ref >39)
LDL Chol Calc (NIH): 68 mg/dL (ref 0–99)
Triglycerides: 59 mg/dL (ref 0–149)
VLDL Cholesterol Cal: 13 mg/dL (ref 5–40)

## 2021-08-25 LAB — HEPATIC FUNCTION PANEL
ALT: 7 IU/L (ref 0–44)
AST: 27 IU/L (ref 0–40)
Albumin: 4.5 g/dL (ref 3.8–4.8)
Alkaline Phosphatase: 54 IU/L (ref 44–121)
Bilirubin Total: 0.8 mg/dL (ref 0.0–1.2)
Bilirubin, Direct: 0.24 mg/dL (ref 0.00–0.40)
Total Protein: 6.2 g/dL (ref 6.0–8.5)

## 2021-09-07 ENCOUNTER — Ambulatory Visit: Payer: Medicare Other | Attending: Cardiovascular Disease | Admitting: Cardiovascular Disease

## 2021-09-07 ENCOUNTER — Encounter: Payer: Self-pay | Admitting: Cardiovascular Disease

## 2021-09-07 DIAGNOSIS — R931 Abnormal findings on diagnostic imaging of heart and coronary circulation: Secondary | ICD-10-CM

## 2021-09-07 DIAGNOSIS — I493 Ventricular premature depolarization: Secondary | ICD-10-CM

## 2021-09-07 DIAGNOSIS — I7121 Aneurysm of the ascending aorta, without rupture: Secondary | ICD-10-CM | POA: Diagnosis not present

## 2021-09-07 DIAGNOSIS — I480 Paroxysmal atrial fibrillation: Secondary | ICD-10-CM

## 2021-09-07 DIAGNOSIS — I10 Essential (primary) hypertension: Secondary | ICD-10-CM | POA: Diagnosis not present

## 2021-09-07 DIAGNOSIS — G4733 Obstructive sleep apnea (adult) (pediatric): Secondary | ICD-10-CM | POA: Diagnosis not present

## 2021-09-07 DIAGNOSIS — I712 Thoracic aortic aneurysm, without rupture, unspecified: Secondary | ICD-10-CM | POA: Insufficient documentation

## 2021-09-07 NOTE — Assessment & Plan Note (Signed)
History of PAF maintaining sinus rhythm on Eliquis oral anticoagulation. 

## 2021-09-07 NOTE — Assessment & Plan Note (Signed)
Recent 2D echo revealed a ascending thoracic aorta measurement of 40 mm.  This will be repeated on an annual basis.

## 2021-09-07 NOTE — Assessment & Plan Note (Signed)
Coronary calcium score performed on 5//23 was 1374.  Subsequent Myoview stress test was negative and 2D echo was normal as well.  Based on this we placed him on a statin which resulted in marked improvement in his lipid profile.

## 2021-09-07 NOTE — Assessment & Plan Note (Signed)
Obstructive sleep apnea demonstrated by sleep study not severe enough to require CPAP.

## 2021-09-07 NOTE — Assessment & Plan Note (Signed)
History of essential hypertension blood pressure measured today at 132/84.  He is on metoprolol.

## 2021-09-07 NOTE — Progress Notes (Signed)
09/07/2021 Donald Norman.   21-Jul-1950  [id number]  Primary Physician Donald Contras, MD Primary Cardiologist: Donald Harp MD Donald Norman, [place], [place]  HPI:  Donald Norman. is a 71 y.o.  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  He was referred by Dr. Antony Norman  for evaluation of palpitations.  Apparently I take care of his Sister Donald Norman as well as his mother Donald Norman who died at age 65.  I last saw him in the office 06/08/2021.  His cardiac risk factor profile is remarkable for treated hypertension, untreated hyperlipidemia.  His father did die of a myocardial infarction.  He is never had a heart attack or stroke.  He denies chest pain or shortness of breath.  He noticed the onset of tachypalpitations July 2022.  He had 15 episodes which she is captured on his "apple watch" which last for seconds to minutes at a time and are self-limited without symptoms.  He has decreased his caffeine intake and has not had any symptoms in the last 3 months.  He had a coronary calcium score performed 05/05/21 which was 1374.  Based on this he underwent a Myoview stress test that showed diaphragmatic attenuation without ischemia and a 2D echo that was essentially normal.  An event monitor because of palpitations showed PVCs, PAT, PSVT and PAF.  He was subsequent begun on Eliquis oral anticoagulation for stroke prophylaxis.  In addition, because of his elevated coronary calcium score he was begun on rosuvastatin which resulted in marked improvement in his cholesterol most recently measured 08/24/2021.  Total cholesterol is 129 with an LDL of 68 and HDL of 48.       Current Meds  Medication Sig   apixaban (ELIQUIS) 5 MG TABS tablet Take 1 tablet (5 mg total) by mouth 2 (two) times daily.   Coenzyme Q10 (CO Q-10) 100 MG CAPS Take 1 tablet by mouth every morning.   Coenzyme Q10  (COQ-10) 200 MG CAPS    flecainide (TAMBOCOR) 50 MG tablet Take 1.5 tablets (75 mg total) by mouth 2 (two) times daily.   fluticasone (FLONASE) 50 MCG/ACT nasal spray Place 1 spray into both nostrils daily.   Ibuprofen 200 MG CAPS Take 400 mg by mouth every other day.   levocetirizine (XYZAL) 5 MG tablet    losartan (COZAAR) 25 MG tablet Take 1 tablet (25 mg total) by mouth daily.   metoprolol succinate (TOPROL-XL) 25 MG 24 hr tablet Take 0.5 tablets (12.5 mg total) by mouth daily. Take with or immediately following a meal.   Multiple Vitamin (MULTI-VITAMIN DAILY) TABS Take 1 tablet by mouth every morning.   Polyethyl Glycol-Propyl Glycol (SYSTANE) 0.4-0.3 % SOLN Place 1 drop into both eyes as needed.   rosuvastatin (CRESTOR) 20 MG tablet Take 1 tablet (20 mg total) by mouth daily.   sertraline (ZOLOFT) 25 MG tablet Take 1 tablet (25 mg total) by mouth daily.   testosterone (ANDROGEL) 50 MG/5GM (1%) GEL Place onto the skin daily.     No Known Allergies  Social History   Socioeconomic History   Marital status: Married    Spouse name: Not on file   Number of children: Not on file   Years of education: college   Highest education level: Not on file  Occupational History   Occupation: programmer  Tobacco Use   Smoking status: Never  Smokeless tobacco: Never  Substance and Sexual Activity   Alcohol use: No    Alcohol/week: 2.0 standard drinks of alcohol    Types: 2 Standard drinks or equivalent per week   Drug use: No   Sexual activity: Yes    Partners: Female  Other Topics Concern   Not on file  Social History Narrative   Not on file   Social Determinants of Health   Financial Resource Strain: Not on file  Food Insecurity: Not on file  Transportation Needs: Not on file  Physical Activity: Not on file  Stress: Not on file  Social Connections: Not on file  Intimate Partner Violence: Not on file     Review of Systems: General: negative for chills, fever, night sweats or  weight changes.  Cardiovascular: negative for chest pain, dyspnea on exertion, edema, orthopnea, palpitations, paroxysmal nocturnal dyspnea or shortness of breath Dermatological: negative for rash Respiratory: negative for cough or wheezing Urologic: negative for hematuria Abdominal: negative for nausea, vomiting, diarrhea, bright red blood per rectum, melena, or hematemesis Neurologic: negative for visual changes, syncope, or dizziness All other systems reviewed and are otherwise negative except as noted above.    Blood pressure 132/84, pulse 78, height 6' (1.829 m), weight 213 lb 12.8 oz (97 kg), SpO2 98 %.  General appearance: alert and no distress Neck: no adenopathy, no carotid bruit, no JVD, supple, symmetrical, trachea midline, and thyroid not enlarged, symmetric, no tenderness/mass/nodules Lungs: clear to auscultation bilaterally Heart: regular rate and rhythm, S1, S2 normal, no murmur, click, rub or gallop Extremities: extremities normal, atraumatic, no cyanosis or edema Pulses: 2+ and symmetric Skin: Skin color, texture, turgor normal. No rashes or lesions Neurologic: Grossly normal  EKG not performed today  ASSESSMENT AND PLAN:   Hypertension History of essential hypertension blood pressure measured today at 132/84.  He is on metoprolol.  PVC (premature ventricular contraction) History of PVCs, PSVT and PAF on monitoring with palpitations.  These have improved after the addition of flecainide by Dr. Lovena Le and beta-blocker.  Dr. Lovena Le mentioned a routine GXT after the initiation of flecainide to rule out proarrhythmia.  He apparently is going to see the patient back in October.  Obstructive sleep apnea Obstructive sleep apnea demonstrated by sleep study not severe enough to require CPAP.  PAF (paroxysmal atrial fibrillation) (HCC) History of PAF maintaining sinus rhythm on Eliquis oral anticoagulation.  Elevated coronary artery calcium score Coronary calcium score  performed on 5//23 was 1374.  Subsequent Myoview stress test was negative and 2D echo was normal as well.  Based on this we placed him on a statin which resulted in marked improvement in his lipid profile.  Thoracic aortic aneurysm (HCC) Recent 2D echo revealed a ascending thoracic aorta measurement of 40 mm.  This will be repeated on an annual basis.     Donald Harp MD FACP,FACC,FAHA, [facility] 09/07/2021 9:40 AM

## 2021-09-07 NOTE — Patient Instructions (Signed)
Medication Instructions:  Your physician recommends that you continue on your current medications as directed. Please refer to the Current Medication list given to you today.  *If you need a refill on your cardiac medications before your next appointment, please call your pharmacy*   Testing/Procedures: Your physician has requested that you have an echocardiogram. Echocardiography is a painless test that uses sound waves to create images of your heart. It provides your doctor with information about the size and shape of your heart and how well your heart's chambers and valves are working. This procedure takes approximately one hour. There are no restrictions for this procedure. To be done in May 2024. This procedure will be done at [address]   Follow-Up: At [facility], you and your health needs are our priority.  As part of our continuing mission to provide you with exceptional heart care, we have created designated Provider Care Teams.  These Care Teams include your primary Cardiologist (physician) and Advanced Practice Providers (APPs -  Physician Assistants and Nurse Practitioners) who all work together to provide you with the care you need, when you need it.  We recommend signing up for the patient portal called "MyChart".  Sign up information is provided on this After Visit Summary.  MyChart is used to connect with patients for Virtual Visits (Telemedicine).  Patients are able to view lab/test results, encounter notes, upcoming appointments, etc.  Non-urgent messages can be sent to your provider as well.   To learn more about what you can do with MyChart, go to [email or web address].    Your next appointment:   6 month(s)  The format for your next appointment:   In Person  Provider:   Quay Burow, MD

## 2021-09-07 NOTE — Assessment & Plan Note (Signed)
History of PVCs, PSVT and PAF on monitoring with palpitations.  These have improved after the addition of flecainide by Dr. Lovena Le and beta-blocker.  Dr. Lovena Le mentioned a routine GXT after the initiation of flecainide to rule out proarrhythmia.  He apparently is going to see the patient back in October.

## 2021-09-12 DIAGNOSIS — R1031 Right lower quadrant pain: Secondary | ICD-10-CM | POA: Diagnosis not present

## 2021-09-12 DIAGNOSIS — R319 Hematuria, unspecified: Secondary | ICD-10-CM | POA: Diagnosis not present

## 2021-09-15 DIAGNOSIS — N201 Calculus of ureter: Secondary | ICD-10-CM | POA: Diagnosis not present

## 2021-09-15 DIAGNOSIS — K5730 Diverticulosis of large intestine without perforation or abscess without bleeding: Secondary | ICD-10-CM | POA: Diagnosis not present

## 2021-09-15 DIAGNOSIS — R310 Gross hematuria: Secondary | ICD-10-CM | POA: Diagnosis not present

## 2021-09-15 DIAGNOSIS — N132 Hydronephrosis with renal and ureteral calculous obstruction: Secondary | ICD-10-CM | POA: Diagnosis not present

## 2021-09-15 DIAGNOSIS — K8020 Calculus of gallbladder without cholecystitis without obstruction: Secondary | ICD-10-CM | POA: Diagnosis not present

## 2021-09-19 ENCOUNTER — Telehealth: Payer: Self-pay | Admitting: *Deleted

## 2021-09-19 NOTE — Telephone Encounter (Signed)
Telephoned patient to confirm that he has been informed of his sleep study results. He states yes he has been informed. He admits that he did get my message and he did not call back "because I knew I was going to see the Dr in the next week or two."

## 2021-09-20 ENCOUNTER — Other Ambulatory Visit (HOSPITAL_COMMUNITY): Payer: Medicare Other

## 2021-10-06 ENCOUNTER — Ambulatory Visit: Payer: Medicare Other | Attending: Internal Medicine | Admitting: Internal Medicine

## 2021-10-06 ENCOUNTER — Ambulatory Visit (INDEPENDENT_AMBULATORY_CARE_PROVIDER_SITE_OTHER): Payer: Medicare Other

## 2021-10-06 VITALS — BP 118/72 | HR 60 | Ht 72.0 in | Wt 215.0 lb

## 2021-10-06 DIAGNOSIS — I10 Essential (primary) hypertension: Secondary | ICD-10-CM | POA: Diagnosis not present

## 2021-10-06 DIAGNOSIS — I480 Paroxysmal atrial fibrillation: Secondary | ICD-10-CM

## 2021-10-06 NOTE — Patient Instructions (Addendum)
Medication Instructions:  Your physician has recommended you make the following change in your medication:  STOP TAKING:  ( Toprol XL 25 Mg )  TODAY  Lab Work: None ordered.  If you have labs (blood work) drawn today and your tests are completely normal, you will receive your results only by: [place] (if you have MyChart) OR A paper copy in the mail If you have any lab test that is abnormal or we need to change your treatment, we will call you to review the results.  Testing/Procedures: 3 Day Zio Monitor order by Dr. Cristopher Peru   Follow-Up:  You will have a follow up appointment with Dr. Cristopher Peru after the 3 Day Zio Monitor results are received by [facility] [address]   [place] Instructions  Your physician has requested you wear a ZIO patch monitor for 14 days.  This is a single patch monitor. [employer] supplies one patch monitor per enrollment. Additional stickers are not available. Please do not apply patch if you will be having a Nuclear Stress Test,  Echocardiogram, Cardiac CT, MRI, or Chest Xray during the period you would be wearing the  monitor. The patch cannot be worn during these tests. You cannot remove and re-apply the  ZIO XT patch monitor.  Your ZIO patch monitor will be mailed 3 day USPS to your address on file. It may take 3-5 days  to receive your monitor after you have been enrolled.  Once you have received your monitor, please review the enclosed instructions. Your monitor  has already been registered assigning a specific monitor serial # to you.  Billing and Patient Assistance Program Information  We have supplied [employer] with any of your insurance information on file for billing purposes. [employer] offers a sliding scale Patient Assistance Program for patients that do not have  insurance, or whose insurance does not completely cover the cost of the ZIO monitor.  You must apply for the Patient Assistance Program to qualify for  this discounted rate.  To apply, please call [employer] at [phone], select option 4, select option 2, ask to apply for  Patient Assistance Program. Donald Norman will ask your household income, and how many people  are in your household. They will quote your out-of-pocket cost based on that information.  [employer] will also be able to set up a 12-month interest-free payment plan if needed.  Applying the monitor   Shave hair from upper left chest.  Hold abrader disc by orange tab. Rub abrader in 40 strokes over the upper left chest as  indicated in your monitor instructions.  Clean area with 4 enclosed alcohol pads. Let dry.  Apply patch as indicated in monitor instructions. Patch will be placed under collarbone on left  side of chest with arrow pointing upward.  Rub patch adhesive wings for 2 minutes. Remove white label marked "1". Remove the white  label marked "2". Rub patch adhesive wings for 2 additional minutes.  While looking in a mirror, press and release button in center of patch. A small green light will  flash 3-4 times. This will be your only indicator that the monitor has been turned on.  Do not shower for the first 24 hours. You may shower after the first 24 hours.  Press the button if you feel a symptom. You will hear a small click. Record Date, Time and  Symptom in the Patient Logbook.  When you are ready to remove the patch, follow instructions on  the last 2 pages of Patient  Logbook. Stick patch monitor onto the last page of Patient Logbook.  Place Patient Logbook in the blue and white box. Use locking tab on box and tape box closed  securely. The blue and white box has prepaid postage on it. Please place it in the mailbox as  soon as possible. Your physician should have your test results approximately 7 days after the  monitor has been mailed back to [facility].  Call [place] at [phone] if you have questions regarding  your ZIO XT patch monitor.  Call them immediately if you see an orange light blinking on your  monitor.  If your monitor falls off in less than 4 days, contact our Monitor department at [phone].  If your monitor becomes loose or falls off after 4 days call [employer] at [phone] for  suggestions on securing your monitor

## 2021-10-06 NOTE — Progress Notes (Unsigned)
Enrolled for [employer] to mail a ZIO XT long term holter monitor to the patients address on file.  

## 2021-10-06 NOTE — Progress Notes (Signed)
HPI [name] returns for ongoing evaluation of palpitations and NS SVT and NS VT. He is a very pleasant 71 yo man with palpitations who was found on cardiac monitoring to have NS SVT and NS VT. He has not had syncope. He denies chest pain or any anginal symptoms. He has minimal dyspnea with exertion. No edema. He has a calcium score of over 1300. I placed him on low dose flecainide and his palpitations have resolved. He notes that he is still fatigued and tired. He wonders if his beta blocker could be contributing. No Known Allergies   Current Outpatient Medications  Medication Sig Dispense Refill   apixaban (ELIQUIS) 5 MG TABS tablet Take 1 tablet (5 mg total) by mouth 2 (two) times daily. 180 tablet 3   Coenzyme Q10 (CO Q-10) 100 MG CAPS Take 1 tablet by mouth every morning.     flecainide (TAMBOCOR) 50 MG tablet Take 1.5 tablets (75 mg total) by mouth 2 (two) times daily. 90 tablet 11   fluticasone (FLONASE) 50 MCG/ACT nasal spray Place 1 spray into both nostrils daily.     Ibuprofen 200 MG CAPS Take 400 mg by mouth every other day.     levocetirizine (XYZAL) 5 MG tablet      losartan (COZAAR) 25 MG tablet Take 1 tablet (25 mg total) by mouth daily. 90 tablet 1   Multiple Vitamin (MULTI-VITAMIN DAILY) TABS Take 1 tablet by mouth every morning.     Polyethyl Glycol-Propyl Glycol (SYSTANE) 0.4-0.3 % SOLN Place 1 drop into both eyes as needed.     rosuvastatin (CRESTOR) 20 MG tablet Take 1 tablet (20 mg total) by mouth daily. 90 tablet 3   sertraline (ZOLOFT) 25 MG tablet Take 1 tablet (25 mg total) by mouth daily. 90 tablet 1   tamsulosin (FLOMAX) 0.4 MG CAPS capsule Take 0.4 mg by mouth daily.     testosterone (ANDROGEL) 50 MG/5GM (1%) GEL Place onto the skin daily.     No current facility-administered medications for this visit.     Past Medical History:  Diagnosis Date   Allergy    Anemia    Basal cell cancer    Cancer (HCC)    Depression    Heart murmur     Hypertension    PVC (premature ventricular contraction)    Tendonitis     ROS:   All systems reviewed and negative except as noted in the HPI.   Past Surgical History:  Procedure Laterality Date   CYSTOSCOPY     EYE SURGERY  lasik and retina repair   HERNIA REPAIR     TONSILLECTOMY AND ADENOIDECTOMY       Family History  Problem Relation Age of Onset   Heart disease Mother    Hypertension Mother    Diabetes Father    Heart disease Father    Hypertension Sister    Anemia Sister    Heart disease Maternal Grandmother    Diabetes Paternal Grandmother      Social History   Socioeconomic History   Marital status: Married    Spouse name: Not on file   Number of children: Not on file   Years of education: college   Highest education level: Not on file  Occupational History   Occupation: Scientist, research (physical sciences)  Tobacco Use   Smoking status: Never   Smokeless tobacco: Never  Substance and Sexual Activity   Alcohol use: No    Alcohol/week: 2.0 standard drinks of alcohol  Types: 2 Standard drinks or equivalent per week   Drug use: No   Sexual activity: Yes    Partners: Female  Other Topics Concern   Not on file  Social History Narrative   Not on file   Social Determinants of Health   Financial Resource Strain: Not on file  Food Insecurity: Not on file  Transportation Needs: Not on file  Physical Activity: Not on file  Stress: Not on file  Social Connections: Not on file  Intimate Partner Violence: Not on file     BP 118/72   Pulse 60   Ht 6' (1.829 m)   Wt 215 lb (97.5 kg)   SpO2 94%   BMI 29.16 kg/m   Physical Exam:  Well appearing NAD HEENT: Unremarkable Neck:  No JVD, no thyromegally Lymphatics:  No adenopathy Back:  No CVA tenderness Lungs:  Clear with no wheezes HEART:  Regular rate rhythm, no murmurs, no rubs, no clicks Abd:  soft, positive bowel sounds, no organomegally, no rebound, no guarding Ext:  2 plus pulses, no edema, no cyanosis, no  clubbing Skin:  No rashes no nodules Neuro:  CN II through XII intact, motor grossly intact  EKG - nsr   Assess/Plan:   NS AT - His symptoms are well controlled. No change in his meds. HTN - he will continue his cozaar but stop the metoprolol PAF - He caries this diagnosis. The flecainide will help.  Coags - he has not had any bleeding on systemic anti-coagulation. Fatigue - this is his biggest complaint. He will  continue with flecainide but stop the beta blocker.     Carleene Overlie Earlene Bjelland,MD

## 2021-10-10 DIAGNOSIS — I10 Essential (primary) hypertension: Secondary | ICD-10-CM

## 2021-10-10 DIAGNOSIS — I480 Paroxysmal atrial fibrillation: Secondary | ICD-10-CM

## 2021-10-10 DIAGNOSIS — N201 Calculus of ureter: Secondary | ICD-10-CM | POA: Diagnosis not present

## 2021-10-11 ENCOUNTER — Encounter: Payer: Self-pay | Admitting: Internal Medicine

## 2021-10-20 DIAGNOSIS — I480 Paroxysmal atrial fibrillation: Secondary | ICD-10-CM | POA: Diagnosis not present

## 2021-10-20 DIAGNOSIS — I10 Essential (primary) hypertension: Secondary | ICD-10-CM | POA: Diagnosis not present

## 2022-01-19 DIAGNOSIS — R948 Abnormal results of function studies of other organs and systems: Secondary | ICD-10-CM | POA: Diagnosis not present

## 2022-02-08 DIAGNOSIS — U071 COVID-19: Secondary | ICD-10-CM | POA: Diagnosis not present

## 2022-02-09 ENCOUNTER — Encounter (HOSPITAL_COMMUNITY): Payer: Self-pay | Admitting: *Deleted

## 2022-03-14 ENCOUNTER — Ambulatory Visit: Payer: Medicare Other | Attending: Cardiovascular Disease | Admitting: Cardiovascular Disease

## 2022-03-14 ENCOUNTER — Encounter: Payer: Self-pay | Admitting: Cardiovascular Disease

## 2022-03-14 VITALS — BP 132/72 | HR 67 | Ht 71.0 in | Wt 226.2 lb

## 2022-03-14 DIAGNOSIS — I480 Paroxysmal atrial fibrillation: Secondary | ICD-10-CM

## 2022-03-14 DIAGNOSIS — I493 Ventricular premature depolarization: Secondary | ICD-10-CM | POA: Diagnosis not present

## 2022-03-14 DIAGNOSIS — I10 Essential (primary) hypertension: Secondary | ICD-10-CM | POA: Diagnosis not present

## 2022-03-14 DIAGNOSIS — R931 Abnormal findings on diagnostic imaging of heart and coronary circulation: Secondary | ICD-10-CM

## 2022-03-14 DIAGNOSIS — E785 Hyperlipidemia, unspecified: Secondary | ICD-10-CM | POA: Insufficient documentation

## 2022-03-14 DIAGNOSIS — E782 Mixed hyperlipidemia: Secondary | ICD-10-CM | POA: Diagnosis not present

## 2022-03-14 NOTE — Assessment & Plan Note (Signed)
Elevated coronary calcium score of 1374 performed 05/05/2021 with a subsequent negative Myoview stress test and normal 2D echo.  He is asymptomatic.

## 2022-03-14 NOTE — Assessment & Plan Note (Signed)
History of PVCs with palpitations improved with flecainide and low-dose beta-blocker.  His most recent Zio patch ordered by Dr. Lovena Le 10/06/2021 showed no significant arrhythmias, significantly improved from his prior Zio patch.

## 2022-03-14 NOTE — Assessment & Plan Note (Signed)
History of hyperlipidemia on rosuvastatin with lipid profile performed 08/24/2021 revealing total cholesterol 129, LDL 68 and HDL 48, at goal for secondary prevention.

## 2022-03-14 NOTE — Assessment & Plan Note (Signed)
History of PAF maintaining sinus rhythm on Eliquis oral anticoagulation. 

## 2022-03-14 NOTE — Patient Instructions (Signed)
Medication Instructions:  Your physician recommends that you continue on your current medications as directed. Please refer to the Current Medication list given to you today.  *If you need a refill on your cardiac medications before your next appointment, please call your pharmacy*   Follow-Up: At [facility], you and your health needs are our priority.  As part of our continuing mission to provide you with exceptional heart care, we have created designated Provider Care Teams.  These Care Teams include your primary Cardiologist (physician) and Advanced Practice Providers (APPs -  Physician Assistants and Nurse Practitioners) who all work together to provide you with the care you need, when you need it.  We recommend signing up for the patient portal called "MyChart".  Sign up information is provided on this After Visit Summary.  MyChart is used to connect with patients for Virtual Visits (Telemedicine).  Patients are able to view lab/test results, encounter notes, upcoming appointments, etc.  Non-urgent messages can be sent to your provider as well.   To learn more about what you can do with MyChart, go to [email or web address].    Your next appointment:   6 month(s)  Provider:   Angela Duke, PA-C, Callie Goodrich, PA-C, Jennifer, Lambert, PA-C, Kathryn Lawrence, DNP, ANP, Hao Meng, PA-C, or Emily Monge, NP      Then, Jonathan Berry, MD will plan to see you again in 12 month(s). 

## 2022-03-14 NOTE — Assessment & Plan Note (Signed)
History of essential hypertension a blood pressure measured today at 132/72.  He is on losartan and low-dose beta-blocker.

## 2022-03-14 NOTE — Progress Notes (Signed)
03/14/2022 Donald Norman.   01/19/1950  [id number]  Primary Physician Donald Contras, MD Primary Cardiologist: Donald Harp MD Donald Norman, [place], [place]  HPI:  Donald Norman. is a 72 y.o.   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  He was referred by Dr. Antony Norman  for evaluation of palpitations.  Apparently I take care of his Sister Donald Norman as well as his mother Donald Norman who died at age 45.  I last saw him in the office 09/07/2021.  His cardiac risk factor profile is remarkable for treated hypertension, untreated hyperlipidemia.  His father did die of a myocardial infarction.  He is never had a heart attack or stroke.  He denies chest pain or shortness of breath.  He noticed the onset of tachypalpitations July 2022.  He had 15 episodes which she is captured on his "apple watch" which last for seconds to minutes at a time and are self-limited without symptoms.  He has decreased his caffeine intake and has not had any symptoms in the last 3 months.   He had a coronary calcium score performed 05/05/21 which was 1374.  Based on this he underwent a Myoview stress test that showed diaphragmatic attenuation without ischemia and a 2D echo that was essentially normal.  An event monitor because of palpitations showed PVCs, PAT, PSVT and PAF.  He was subsequent begun on Eliquis oral anticoagulation for stroke prophylaxis.  In addition, because of his elevated coronary calcium score he was begun on rosuvastatin which resulted in marked improvement in his cholesterol most recently measured 08/24/2021.  Total cholesterol is 129 with an LDL of 68 and HDL of 48.  Since I saw him 6 months ago he is remained stable.  He does complain of some afternoon fatigue but tested negative for obstructive sleep apnea.  He denies chest pain or shortness of breath.  He did see Dr. Lovena Le my request  for his palpitations and was begun on flecainide.  His palpitations have significantly improved and his subsequent follow-up Zio patch ordered by Dr. Lovena Le 10/06/2021 was significantly improved as well.   Current Meds  Medication Sig   apixaban (ELIQUIS) 5 MG TABS tablet Take 1 tablet (5 mg total) by mouth 2 (two) times daily.   Coenzyme Q10 (CO Q-10) 100 MG CAPS Take 1 tablet by mouth every morning.   flecainide (TAMBOCOR) 50 MG tablet Take 1.5 tablets (75 mg total) by mouth 2 (two) times daily.   fluticasone (FLONASE) 50 MCG/ACT nasal spray Place 1 spray into both nostrils daily.   Ibuprofen 200 MG CAPS Take 400 mg by mouth every other day.   levocetirizine (XYZAL) 5 MG tablet    losartan (COZAAR) 25 MG tablet Take 1 tablet (25 mg total) by mouth daily.   Multiple Vitamin (MULTI-VITAMIN DAILY) TABS Take 1 tablet by mouth every morning.   Polyethyl Glycol-Propyl Glycol (SYSTANE) 0.4-0.3 % SOLN Place 1 drop into both eyes as needed.   rosuvastatin (CRESTOR) 20 MG tablet Take 1 tablet (20 mg total) by mouth daily.   sertraline (ZOLOFT) 25 MG tablet Take 1 tablet (25 mg total) by mouth daily.   testosterone (ANDROGEL) 50 MG/5GM (1%) GEL Place onto the skin daily.     No Known Allergies  Social History   Socioeconomic History   Marital status: Married    Spouse name: Not on file  Number of children: Not on file   Years of education: college   Highest education level: Not on file  Occupational History   Occupation: programmer  Tobacco Use   Smoking status: Never   Smokeless tobacco: Never  Substance and Sexual Activity   Alcohol use: No    Alcohol/week: 2.0 standard drinks of alcohol    Types: 2 Standard drinks or equivalent per week   Drug use: No   Sexual activity: Yes    Partners: Female  Other Topics Concern   Not on file  Social History Narrative   Not on file   Social Determinants of Health   Financial Resource Strain: Not on file  Food Insecurity: Not on file   Transportation Needs: Not on file  Physical Activity: Not on file  Stress: Not on file  Social Connections: Not on file  Intimate Partner Violence: Not on file     Review of Systems: General: negative for chills, fever, night sweats or weight changes.  Cardiovascular: negative for chest pain, dyspnea on exertion, edema, orthopnea, palpitations, paroxysmal nocturnal dyspnea or shortness of breath Dermatological: negative for rash Respiratory: negative for cough or wheezing Urologic: negative for hematuria Abdominal: negative for nausea, vomiting, diarrhea, bright red blood per rectum, melena, or hematemesis Neurologic: negative for visual changes, syncope, or dizziness All other systems reviewed and are otherwise negative except as noted above.    Blood pressure 132/72, pulse 67, height '5\' 11"'$  (1.803 m), weight 226 lb 3.2 oz (102.6 kg), SpO2 96 %.  General appearance: alert and no distress Neck: no adenopathy, no carotid bruit, no JVD, supple, symmetrical, trachea midline, and thyroid not enlarged, symmetric, no tenderness/mass/nodules Lungs: clear to auscultation bilaterally Heart: regular rate and rhythm, S1, S2 normal, no murmur, click, rub or gallop Extremities: extremities normal, atraumatic, no cyanosis or edema Pulses: 2+ and symmetric Skin: Skin color, texture, turgor normal. No rashes or lesions Neurologic: Grossly normal  EKG sinus rhythm at 67 without ST or T wave changes.  Personally reviewed this EKG.  ASSESSMENT AND PLAN:   Hypertension History of essential hypertension a blood pressure measured today at 132/72.  He is on losartan and low-dose beta-blocker.  PVC (premature ventricular contraction) History of PVCs with palpitations improved with flecainide and low-dose beta-blocker.  His most recent Zio patch ordered by Dr. Lovena Le 10/06/2021 showed no significant arrhythmias, significantly improved from his prior Zio patch.  PAF (paroxysmal atrial fibrillation)  (HCC) History of PAF maintaining sinus rhythm on Eliquis oral anticoagulation.  Elevated coronary artery calcium score Elevated coronary calcium score of 1374 performed 05/05/2021 with a subsequent negative Myoview stress test and normal 2D echo.  He is asymptomatic.  Hyperlipidemia History of hyperlipidemia on rosuvastatin with lipid profile performed 08/24/2021 revealing total cholesterol 129, LDL 68 and HDL 48, at goal for secondary prevention.     Donald Harp MD FACP,FACC,FAHA, [facility] 03/14/2022 9:12 AM

## 2022-03-15 ENCOUNTER — Encounter: Payer: Self-pay | Admitting: Cardiovascular Disease

## 2022-05-02 DIAGNOSIS — I10 Essential (primary) hypertension: Secondary | ICD-10-CM | POA: Diagnosis not present

## 2022-05-02 DIAGNOSIS — J309 Allergic rhinitis, unspecified: Secondary | ICD-10-CM | POA: Diagnosis not present

## 2022-05-02 DIAGNOSIS — H9193 Unspecified hearing loss, bilateral: Secondary | ICD-10-CM | POA: Diagnosis not present

## 2022-05-02 DIAGNOSIS — I480 Paroxysmal atrial fibrillation: Secondary | ICD-10-CM | POA: Diagnosis not present

## 2022-05-02 DIAGNOSIS — Z0000 Encounter for general adult medical examination without abnormal findings: Secondary | ICD-10-CM | POA: Diagnosis not present

## 2022-05-02 DIAGNOSIS — E349 Endocrine disorder, unspecified: Secondary | ICD-10-CM | POA: Diagnosis not present

## 2022-05-02 DIAGNOSIS — E782 Mixed hyperlipidemia: Secondary | ICD-10-CM | POA: Diagnosis not present

## 2022-05-02 DIAGNOSIS — D6869 Other thrombophilia: Secondary | ICD-10-CM | POA: Diagnosis not present

## 2022-05-02 DIAGNOSIS — Z1211 Encounter for screening for malignant neoplasm of colon: Secondary | ICD-10-CM | POA: Diagnosis not present

## 2022-05-16 ENCOUNTER — Other Ambulatory Visit: Payer: Self-pay | Admitting: Cardiovascular Disease

## 2022-06-14 ENCOUNTER — Other Ambulatory Visit: Payer: Self-pay | Admitting: Cardiovascular Disease

## 2022-06-25 ENCOUNTER — Other Ambulatory Visit: Payer: Self-pay | Admitting: Cardiovascular Disease

## 2022-06-25 ENCOUNTER — Other Ambulatory Visit: Payer: Self-pay | Admitting: Internal Medicine

## 2022-06-25 DIAGNOSIS — I480 Paroxysmal atrial fibrillation: Secondary | ICD-10-CM

## 2022-06-26 NOTE — Telephone Encounter (Signed)
Eliquis 5mg  refill request received. Patient is 72 years old, weight-102.6kg, Crea-0.97 on 05/02/22 via [facility] from [place], [place], and last seen by Dr. Allyson Sabal on 03/14/22. Dose is appropriate based on dosing criteria. Will send in refill to requested pharmacy.

## 2022-07-14 DIAGNOSIS — R948 Abnormal results of function studies of other organs and systems: Secondary | ICD-10-CM | POA: Diagnosis not present

## 2022-07-20 DIAGNOSIS — E349 Endocrine disorder, unspecified: Secondary | ICD-10-CM | POA: Diagnosis not present

## 2022-08-28 NOTE — Progress Notes (Deleted)
Cardiology Office Note:    Date:  08/28/2022   ID:  Donald Mow., DOB [birth year], MRN [id number]  PCP:  [name], MD  Cardiologist:  None  Electrophysiologist:  None   Referring MD: [name], MD   Chief Complaint: of coronary artery calcifications, atrial fibrillation, and SVT  History of Present Illness:    Donald Propes. is a 72 y.o. male with a history of coronary artery calcifications with elevated coronary calcium score of 1,374 in 05/2021, paroxysmal atrial fibrillation on Eliquis, paroxysmal SVT and NSVT noted on monitor in 05/2021, hypertension, and hyperlipidemia who is followed by Dr. Allyson Sabal and presents today for routine follow-up.   Patient was referred to Dr. Allyson Sabal in 04/2021 after not having been seen by Cardiology since 2014. He reported 15 episodes of tachypalpitations the prior summer in 07/2020 which were captured on his Apple watch. He decreased his caffeine intake with resolution of symptoms. Zio monitor, Echo, coronary calcium score, and study were ordered. Zio monitor showed predominantly sinus rhythm with frequent PVCs, occasional PACs, 24 short runs of NSVT (longest run 7 beats), 225 runs of SVT (longest run lasting 17.8 seconds), and runs of paroxysmal atrial fibrillation (<1% burden); therefore, he was started on Eliquis. Echo showed LVEF of 60-65% with normal wall motion and grade 1 diastolic dysfunction, moderately enlarged RV with normal systolic function, and mild dilatation of the ascending aorta measuring 40 mm. Coronary calcium score came back elevated at 1,374 (90th percentile for age and sex). Therefore, a Myoview was ordered and showed a severe defect at rest worse in the basal inferior and inferoseptal wall. Pattern was not felt to be consistent with ischemia or infarct. Cardiac catheterization was considered but plan was to defer this unless he develops symptoms concerning for angina. He was referred to EP for further evaluation of his palpitations and  arrhythmias and was started on Flecainide in 10/2021 with significant improvement in his palpitations. Repeat monitor in 10/2021  showed predominant sinus rhythm with rare PACs/ PVCs, non-sustained SVT (lasting <10 seconds), and no atrial fibrillation.  He was last seen by Dr. Allyson Sabal 03/2022 at which time he was stable from a cardiac standpoint.  Patient presents today for follow-up. ***  Coronary Artery Calcifications Coronary calcium score elevated at 1,374 (90th percentile for age and sex) in 05/2021. Myoview showed severe defect at rest worse in the basal inferior and inferoseptal wall. Pattern was not felt to be consistent with ischemia or infarct. Cardiac catheterization was considered but plan was to defer this unless he develops symptoms concerning for angina.  - No chest pain.  - No aspirin due to need for Eliquis. - Continue high-intensity statin.  Palpitations Paroxysmal Atrial Fibrillation Paroxysmal SVT Patient has a history of palpitations. Monitor in frequent PVCs, occasional PACs, 24 short runs of NSVT (longest run 7 beats), 225 runs of SVT (longest run lasting 17.8 seconds), and runs of paroxysmal atrial fibrillation (<1% burden. He was started on low dose beta-blocker and Flecainide with improvement in symptoms. Repeat monitor in 10/2021 showed predominant sinus rhythm with rare PACs/ PVCs, non-sustained SVT (lasting <10 seconds), and no atrial fibrillation. - Palpitations well controlled. *** - Continue Flecainide 75mg  twice daily. - Continue Toprol-XL 12.5mg  daily.  - Continue Eliquis 5mg  twice daily. - Will order ETT now that patient is on Flecainide. ***  Hypertension BP well controlled. *** - Continue Toprol-XL 12.5mg  daily.  Hyperlipidemia Lipid panel in 08/2021: Total Cholesterol 129, Triglycerides 59, HDL 48, LDL 68. LDL  goal <70 given elevated coronary calcium score. - Continue Crestor 20mg  daily.  - Will repeat lipid panel and LFTs. ***   EKGs/Labs/Other Studies  Reviewed:    The following studies were reviewed:  Echocardiogram 05/05/2021: Impressions: 1. Left ventricular ejection fraction, by estimation, is 60 to 65%. The  left ventricle has normal function. The left ventricle has no regional  wall motion abnormalities. Left ventricular diastolic parameters are  consistent with Grade I diastolic  dysfunction (impaired relaxation).   2. Right ventricular systolic function is normal. The right ventricular  size is moderately enlarged.   3. The mitral valve is degenerative. Trivial mitral valve regurgitation.  No evidence of mitral stenosis.   4. The aortic valve is tricuspid. Aortic valve regurgitation is not  visualized. Aortic valve sclerosis/calcification is present, without any  evidence of aortic stenosis.   5. Aortic dilatation noted. There is mild dilatation of the ascending  aorta, measuring 40 mm.   6. The inferior vena cava is normal in size with greater than 50%  respiratory variability, suggesting right atrial pressure of 3 mmHg.  _______________  CT Cardiac Scoring 05/05/2021: Impression: Coronary calcium score of 1374. This was 90 percentile for age-, race-, and sex-matched controls. _______________  Monitor 05/07/2021 to 05/21/2021: Patient had a min HR of 45 bpm, max HR of 218 bpm, and avg HR of 75 bpm. Predominant underlying rhythm was Sinus Rhythm. 24 Ventricular Tachycardia runs occurred, the run with the fastest interval lasting 5 beats with a max rate of 218 bpm, the longest  lasting 7 beats with an avg rate of 179 bpm. 225 Supraventricular Tachycardia runs occurred, the run with the fastest interval lasting 9 beats with a max rate of 210 bpm, the longest lasting 17.8 secs with an avg rate of 118 bpm. Episode of  Supraventricular Tachycardia may be possible Atrial Tachycardia with variable block. Atrial Fibrillation occurred (<1% burden), ranging from 84-146 bpm (avg of 108 bpm), the longest lasting 18 mins 56 secs with an avg  rate of 109 bpm. Supraventricular  Tachycardia was detected within +/- 45 seconds of symptomatic patient event(s). Isolated SVEs were rare (<1.0%), SVE Couplets were rare (<1.0%), and SVE Triplets were rare (<1.0%). Isolated VEs were occasional (3.3%, 49370), VE Couplets were rare (<1.0%,  1337), and VE Triplets were rare (<1.0%, 131). Ventricular Bigeminy and Trigeminy were present.   1. SR/SB/ST 2. Occasional PACs, more freq PVCs 3. Runs of PAF 4. Runs of SVT and NSVT 5. Needs ROV to discuss _______________  Myoview 05/26/2021:   Findings are equivocal. The study is intermediate risk.   No ST deviation was noted.   LV perfusion is equivocal. There is no evidence of ischemia.   Left ventricular function is normal. End diastolic cavity size is mildly enlarged. End systolic cavity size is normal.   Prior study not available for comparison.   There is a severe defect at rest, worst in basal inferior/inferoseptal wall with gradual improvement approaching the apex. This is improved with stress. There is normal wall motion in this area. There is no clear diaphragmatic attenuation or extracardiac activity in the area that supports artifact, but pattern is not consistent with ischemia/infarct either. Overall equivocal. Size of defect makes study intermediate risk. Would consider alternative evaluation for ischemia. _______________  Monitor 10/10/2021 to 10/13/2021: Patient had a min HR of 51 bpm, max HR of 167 bpm, and avg HR of 69 bpm. Predominant underlying rhythm was Sinus Rhythm. 17 Supraventricular Tachycardia runs occurred, the run with  the fastest interval lasting 19 beats with a max rate of 167 bpm, the  longest lasting 18 beats with an avg rate of 128 bpm. Isolated SVEs were rare (<1.0%), SVE Couplets were rare (<1.0%), and SVE Triplets were rare (<1.0%). Isolated VEs were rare (<1.0%), VE Couplets were rare (<1.0%), and no VE Triplets were present.  Ventricular Bigeminy and Trigeminy were  present.   NSR with sinus brady and sinus tachycardia NS SVT lasting less than 10 seconds. Rare PAC's and PVC's No atrial fib No prolonged pauses   EKG:  EKG not ordered today. EKG personally reviewed and demonstrates ***.  Recent Labs: No results found for requested labs within last 365 days.  Recent Lipid Panel    Component Value Date/Time   CHOL 129 08/24/2021 0843   TRIG 59 08/24/2021 0843   HDL 48 08/24/2021 0843   CHOLHDL 2.7 08/24/2021 0843   CHOLHDL 3.8 10/06/2014 1025   VLDL 19 10/06/2014 1025   LDLCALC 68 08/24/2021 0843    Physical Exam:    Vital Signs: There were no vitals taken for this visit.    Wt Readings from Last 3 Encounters:  03/14/22 226 lb 3.2 oz (102.6 kg)  10/06/21 215 lb (97.5 kg)  09/07/21 213 lb 12.8 oz (97 kg)     General: 73 y.o. male in no acute distress. HEENT: Normocephalic and atraumatic. Sclera clear. EOMs intact. Neck: Supple. No carotid bruits. No JVD. Heart: *** RRR. Distinct S1 and S2. No murmurs, gallops, or rubs. Radial and distal pedal pulses 2+ and equal bilaterally. Lungs: No increased work of breathing. Clear to ausculation bilaterally. No wheezes, rhonchi, or rales.  Abdomen: Soft, non-distended, and non-tender to palpation. Bowel sounds present in all 4 quadrants.  MSK: Normal strength and tone for age. *** Extremities: No lower extremity edema.    Skin: Warm and dry. Neuro: Alert and oriented x3. No focal deficits. Psych: Normal affect. Responds appropriately.   Assessment:    No diagnosis found.  Plan:     Disposition: Follow up in ***   Medication Adjustments/Labs and Tests Ordered: Current medicines are reviewed at length with the patient today.  Concerns regarding medicines are outlined above.  No orders of the defined types were placed in this encounter.  No orders of the defined types were placed in this encounter.   There are no Patient Instructions on file for this visit.   Leanne Lovely, PA-C  08/28/2022 10:47 AM    [facility]

## 2022-09-07 ENCOUNTER — Ambulatory Visit (HOSPITAL_COMMUNITY): Payer: Medicare Other | Attending: Cardiovascular Disease

## 2022-09-07 DIAGNOSIS — I493 Ventricular premature depolarization: Secondary | ICD-10-CM | POA: Insufficient documentation

## 2022-09-07 DIAGNOSIS — G4733 Obstructive sleep apnea (adult) (pediatric): Secondary | ICD-10-CM | POA: Diagnosis not present

## 2022-09-07 DIAGNOSIS — R931 Abnormal findings on diagnostic imaging of heart and coronary circulation: Secondary | ICD-10-CM | POA: Diagnosis not present

## 2022-09-07 DIAGNOSIS — I10 Essential (primary) hypertension: Secondary | ICD-10-CM | POA: Insufficient documentation

## 2022-09-07 DIAGNOSIS — I480 Paroxysmal atrial fibrillation: Secondary | ICD-10-CM | POA: Diagnosis not present

## 2022-09-07 DIAGNOSIS — I7121 Aneurysm of the ascending aorta, without rupture: Secondary | ICD-10-CM | POA: Insufficient documentation

## 2022-09-07 LAB — ECHOCARDIOGRAM COMPLETE
Area-P 1/2: 3.46 cm2
S' Lateral: 2.3 cm

## 2022-09-10 NOTE — Progress Notes (Unsigned)
Cardiology Office Note:  .   Date:  09/11/2022  ID:  [name], DOB August 22, 1950, MRN [id number] PCP: [name], MD  [facility] Providers Cardiologist:  [name], MD    History of Present Illness: .   Camerino Lazer. is a 72 y.o. male with past medical history of coronary artery calcification with elevated coronary calcium score of 1374 in May 2023, paroxysmal A-fib, paroxysmal SVT and NSVT, HTN, HLD.  He is followed by Dr. Allyson Sabal. He presents today for routine follow up.   Mr. Dusch was referred back to Dr. Allyson Sabal in 02/2021, he had last been seen by cardiology in 2014.  He reported 15 episodes of palpitations during the summer of 07/2020 which were captured on his Apple Watch.  Reported that he decreased his caffeine intake with resolution of symptoms.  He wore a ZIO monitor that showed predominantly sinus rhythm with frequent PVCs, occasional PACs, 24 short runs of NSVT (longest 7 beats), 225 runs of SVT, (longest 17.8 seconds, and runs of PAF with less than 1% burden, he was started on Eliquis.  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  He underwent coronary calcium scoring which was elevated at 1374, which was 90th percentile for age and sex matched controls.  He underwent a Myoview which showed a severe defect at rest worse in the basal inferior inferoseptal wall.  This was not felt to be consistent with ischemia or infarct.  Cardiac cath was considered but this was deferred unless he develops symptoms concerning for angina.  For further evaluation of his palpitations arrhythmia he was referred to EP who started him on flecainide in 10/2021.  Repeat monitor in 10/2021 showed predominant sinus rhythm with rare PACs/PVCs, nonsustained SVT lasting less than 10 seconds, and no atrial fibrillation.  He was last seen by Dr. Allyson Sabal in 03/2022,  he had remained stable from a cardiac perspective.  He underwent repeat echocardiogram on 09/07/2022 that indicated the EF of 60 to 65%, LV with normal function, no RWMA, G1 DD, RV systolic function was normal with normal size, borderline dilation of the ascending aorta was noted at 37 mm.  Mr. Seright presents for follow-up. He reports increased episodes of presyncope and fatigue in the recent weeks occurring nearly every day.  He reports that this can occur with position changes but also can occur when he has been standing for an extended period of time.  Episodes can last 15 to 30 minutes, then he feels fatigued for the rest of the day.  He does endorse a feeling of slight fluttering in his chest with these episodes, he does check his Apple Watch which indicates sinus rhythm with these events.  He denies chest pain or shortness of breath.  He does note that he was having these episodes last summer up to when he last saw [name] in October, he trialed stopping flecainide for 2 days with no improvement.  He did not have these episodes while wearing his cardiac monitor in October 2023. He notes that the episodes went away during the winter and restarted a few weeks ago.  He notes that he has not extremely active, he does not deliberately exercise.  This weekend he did have many chores to complete a notes including moving furniture, he tolerated well although did note increased fatigue following.  ROS: Today  he denies chest pain, shortness of breath, lower extremity edema, melena, hematuria, hemoptysis, diaphoresis, weakness, presyncope, syncope, orthopnea, and PND.   Studies Reviewed: Marland Kitchen   EKG Interpretation Date/Time:  Monday September 11 2022 09:49:21 EDT Ventricular Rate:  61 PR Interval:  188 QRS Duration:  104 QT Interval:  414 QTC Calculation: 416 R Axis:   -21  Text Interpretation: Normal sinus rhythm Normal ECG Confirmed by Reather Littler [phone]) on 09/11/2022 10:00:11 AM   Cardiac Studies &  Procedures     STRESS TESTS  MYOCARDIAL PERFUSION IMAGING 05/26/2021  Narrative   Findings are equivocal. The study is intermediate risk.   No ST deviation was noted.   LV perfusion is equivocal. There is no evidence of ischemia.   Left ventricular function is normal. End diastolic cavity size is mildly enlarged. End systolic cavity size is normal.   Prior study not available for comparison.  There is a severe defect at rest, worst in basal inferior/inferoseptal wall with gradual improvement approaching the apex. This is improved with stress. There is normal wall motion in this area. There is no clear diaphragmatic attenuation or extracardiac activity in the area that supports artifact, but pattern is not consistent with ischemia/infarct either. Overall equivocal. Size of defect makes study intermediate risk. Would consider alternative evaluation for ischemia.   ECHOCARDIOGRAM  ECHOCARDIOGRAM COMPLETE 09/07/2022  Narrative ECHOCARDIOGRAM REPORT    Patient Name:   Davidanthony Masino. Date of Exam: 09/07/2022 Medical Rec #:  [id number]      Height:       71.0 in Accession #:    [id number]     Weight:       226.2 lb Date of Birth:  July 06, 1950      BSA:          2.222 m Patient Age:    72 years       BP:           132/72 mmHg Patient Gender: M              HR:           65 bpm. Exam Location:  [address]  Procedure: 2D Echo, Cardiac Doppler and Color Doppler  Indications:    I10 Hypertension  History:        Patient has prior history of Echocardiogram examinations, most recent 05/05/2021. Arrythmias:PVC and Atrial Fibrillation, Signs/Symptoms:Murmur; Risk Factors:Sleep Apnea and Dyslipidemia. Elevated coronary artery calcium score. Atrial tachycardia. Aneurysm of ascending aorta without rupture.  Sonographer:    Cathie Beams RCS Referring Phys: [phone] JONATHAN J BERRY  IMPRESSIONS   1. Left ventricular ejection fraction, by estimation, is 60 to 65%. The left ventricle has normal  function. The left ventricle has no regional wall motion abnormalities. Left ventricular diastolic parameters are consistent with Grade I diastolic dysfunction (impaired relaxation). 2. Right ventricular systolic function is normal. The right ventricular size is normal. There is normal pulmonary artery systolic pressure. The estimated right ventricular systolic pressure is 22.7 mmHg. 3. The mitral valve is normal in structure. Trivial mitral valve regurgitation. No evidence of mitral stenosis. 4. The aortic valve is tricuspid. Aortic valve regurgitation is not visualized. No aortic stenosis is present. 5. Aortic dilatation noted. There is borderline dilatation of the ascending aorta, measuring 37 mm. 6. The inferior vena cava is normal in size with greater than 50% respiratory variability, suggesting right atrial pressure of 3 mmHg.  FINDINGS Left Ventricle: Left ventricular ejection fraction, by estimation, is 60 to 65%.  The left ventricle has normal function. The left ventricle has no regional wall motion abnormalities. The left ventricular internal cavity size was normal in size. There is no left ventricular hypertrophy. Left ventricular diastolic parameters are consistent with Grade I diastolic dysfunction (impaired relaxation).  Right Ventricle: The right ventricular size is normal. No increase in right ventricular wall thickness. Right ventricular systolic function is normal. There is normal pulmonary artery systolic pressure. The tricuspid regurgitant velocity is 2.22 m/s, and with an assumed right atrial pressure of 3 mmHg, the estimated right ventricular systolic pressure is 22.7 mmHg.  Left Atrium: Left atrial size was normal in size.  Right Atrium: Right atrial size was normal in size.  Pericardium: There is no evidence of pericardial effusion.  Mitral Valve: The mitral valve is normal in structure. Trivial mitral valve regurgitation. No evidence of mitral valve stenosis.  Tricuspid  Valve: The tricuspid valve is normal in structure. Tricuspid valve regurgitation is trivial.  Aortic Valve: The aortic valve is tricuspid. Aortic valve regurgitation is not visualized. No aortic stenosis is present.  Pulmonic Valve: The pulmonic valve was normal in structure. Pulmonic valve regurgitation is not visualized.  Aorta: The aortic root is normal in size and structure and aortic dilatation noted. There is borderline dilatation of the ascending aorta, measuring 37 mm.  Venous: The inferior vena cava is normal in size with greater than 50% respiratory variability, suggesting right atrial pressure of 3 mmHg.  IAS/Shunts: No atrial level shunt detected by color flow Doppler.   LEFT VENTRICLE PLAX 2D LVIDd:         4.50 cm   Diastology LVIDs:         2.30 cm   LV e' medial:    8.27 cm/s LV PW:         0.90 cm   LV E/e' medial:  9.2 LV IVS:        1.10 cm   LV e' lateral:   11.20 cm/s LVOT diam:     2.00 cm   LV E/e' lateral: 6.8 LV SV:         73 LV SV Index:   33 LVOT Area:     3.14 cm   RIGHT VENTRICLE RV Basal diam:  3.70 cm RV S prime:     13.40 cm/s TAPSE (M-mode): 2.9 cm RVSP:           22.7 mmHg  LEFT ATRIUM             Index        RIGHT ATRIUM           Index LA diam:        3.70 cm 1.67 cm/m   RA Pressure: 3.00 mmHg LA Vol (A2C):   39.3 ml 17.69 ml/m  RA Area:     15.20 cm LA Vol (A4C):   41.1 ml 18.50 ml/m  RA Volume:   32.80 ml  14.76 ml/m LA Biplane Vol: 42.4 ml 19.08 ml/m AORTIC VALVE LVOT Vmax:   106.00 cm/s LVOT Vmean:  69.400 cm/s LVOT VTI:    0.231 m  AORTA Ao Root diam: 3.30 cm Ao Asc diam:  3.70 cm  MITRAL VALVE               TRICUSPID VALVE MV Area (PHT): 3.46 cm    TR Peak grad:   19.7 mmHg MV Decel Time: 219 msec    TR Vmax:        222.00 cm/s MV  E velocity: 76.00 cm/s  Estimated RAP:  3.00 mmHg MV A velocity: 93.00 cm/s  RVSP:           22.7 mmHg MV E/A ratio:  0.82 SHUNTS Systemic VTI:  0.23 m Systemic Diam: 2.00 cm  Dalton  McleanMD Electronically signed by Wilfred Lacy Signature Date/Time: 09/07/2022/10:19:01 AM    Final    MONITORS  LONG TERM MONITOR (3-14 DAYS) 10/20/2021  Narrative NSR with sinus brady and sinus tachycardia NS SVT lasting less than 10 seconds. Rare PAC's and PVC's No atrial fib No prolonged pauses  Gregg Taylor,MD  Patch Wear Time:  2 days and 23 hours (2023-10-09T20:20:46-0400 to 2023-10-12T19:51:32-0400)  Patient had a min HR of 51 bpm, max HR of 167 bpm, and avg HR of 69 bpm. Predominant underlying rhythm was Sinus Rhythm. 17 Supraventricular Tachycardia runs occurred, the run with the fastest interval lasting 19 beats with a max rate of 167 bpm, the longest lasting 18 beats with an avg rate of 128 bpm. Isolated SVEs were rare (<1.0%), SVE Couplets were rare (<1.0%), and SVE Triplets were rare (<1.0%). Isolated VEs were rare (<1.0%), VE Couplets were rare (<1.0%), and no VE Triplets were present. Ventricular Bigeminy and Trigeminy were present.   CT SCANS  CT CARDIAC SCORING (SELF PAY ONLY) 05/05/2021  Addendum 05/05/2021  7:05 PM ADDENDUM REPORT: 05/05/2021 19:02  ADDENDUM: Cardiovascular Disease Risk stratification  EXAM: Coronary Calcium Score  TECHNIQUE: A gated, non-contrast computed tomography scan of the heart was performed using 3mm slice thickness. Axial images were analyzed on a dedicated workstation. Calcium scoring of the coronary arteries was performed using the Agatston method.  FINDINGS: Coronary arteries: Normal origins.  Coronary Calcium Score:  Left main: 0  Left anterior descending artery: 374  Left circumflex artery: 389  Right coronary artery: 610  Total: 1374  Percentile: 90  Pericardium: Normal.  Ascending Aorta: Normal caliber.  Non-cardiac: See separate report from [facility].  IMPRESSION: Coronary calcium score of 1374. This was 90 percentile for age-, race-, and sex-matched  controls.  RECOMMENDATIONS: Coronary artery calcium (CAC) score is a strong predictor of incident coronary heart disease (CHD) and provides predictive information beyond traditional risk factors. CAC scoring is reasonable to use in the decision to withhold, postpone, or initiate statin therapy in intermediate-risk or selected borderline-risk asymptomatic adults (age 28-75 years and LDL-C >=70 to <190 mg/dL) who do not have diabetes or established atherosclerotic cardiovascular disease (ASCVD).* In intermediate-risk (10-year ASCVD risk >=7.5% to <20%) adults or selected borderline-risk (10-year ASCVD risk >=5% to <7.5%) adults in whom a CAC score is measured for the purpose of making a treatment decision the following recommendations have been made:  If CAC=0, it is reasonable to withhold statin therapy and reassess in 5 to 10 years, as long as higher risk conditions are absent (diabetes mellitus, family history of premature CHD in first degree relatives (males <55 years; females <65 years), cigarette smoking, or LDL >=190 mg/dL).  If CAC is 1 to 99, it is reasonable to initiate statin therapy for patients >=77 years of age.  If CAC is >=100 or >=75th percentile, it is reasonable to initiate statin therapy at any age.  Cardiology referral should be considered for patients with CAC scores >=400 or >=75th percentile.  *2018 AHA/ACC/AACVPR/AAPA/ABC/ACPM/ADA/AGS/APhA/ASPC/NLA/PCNA Guideline on the Management of Blood Cholesterol: A Report of the American College of Cardiology/American Heart Association Task Force on Clinical Practice Guidelines. J Am Coll Cardiol. 2019;73(24):3168-3209.  Thomasene Ripple, DO [facility]  The noncardiac portion  of this study will be interpreted in separate report by the radiologist.   Electronically Signed By: Thomasene Ripple D.O. On: 05/05/2021 19:02  Narrative CLINICAL DATA:  This over-read does not include interpretation of cardiac or coronary anatomy  or pathology. The coronary calcium score interpretation by the cardiologist is attached.  COMPARISON:  None Available.  FINDINGS: Vascular: Heart is normal size. Aorta normal caliber. Scattered calcifications in the aortic root and arch.  Mediastinum/Nodes: No adenopathy  Lungs/Pleura: Areas of scarring in the lung bases. Elevation of the right hemidiaphragm. No acute confluent opacities or effusions.  Upper Abdomen: Small gallstone layering within the gallbladder. Scattered low-density lesions in the liver measuring up to 3 cm most compatible with cysts. No acute findings.  Musculoskeletal: Chest wall soft tissues are unremarkable. No acute bony abnormality.  IMPRESSION: Scattered aortic atherosclerosis.  Cholelithiasis.  Scattered hepatic cysts.  No acute extra cardiac abnormality.  Electronically Signed: By: Charlett Nose M.D. On: 05/05/2021 10:58           Risk Assessment/Calculations:    CHA2DS2-VASc Score = 3   This indicates a 3.2% annual risk of stroke. The patient's score is based upon: CHF History: 0 HTN History: 1 Diabetes History: 0 Stroke History: 0 Vascular Disease History: 1 Age Score: 1 Gender Score: 0            Physical Exam:   VS:  BP 135/85   Pulse 64   Ht 5\' 11"  (1.803 m)   Wt 232 lb (105.2 kg)   SpO2 97%   BMI 32.36 kg/m    Wt Readings from Last 3 Encounters:  09/11/22 232 lb (105.2 kg)  03/14/22 226 lb 3.2 oz (102.6 kg)  10/06/21 215 lb (97.5 kg)    GEN: Well nourished, well developed in no acute distress NECK: No JVD; No carotid bruits CARDIAC: RRR, no murmurs, rubs, gallops RESPIRATORY:  Clear to auscultation without rales, wheezing or rhonchi  ABDOMEN: Soft, non-tender, non-distended EXTREMITIES:  No edema; No deformity   ASSESSMENT AND PLAN: .    Presyncope/fatigue/palpitations/Paroxysmal SVT/Afib: Monitor in /2023 showed predominantly sinus rhythm with frequent PVCs, occasional PACs, 24 short runs of NSVT (longest 7  beats), 225 runs of SVT, (longest 17.8 seconds, and runs of PAF with less than 1% burden, he was started on Eliquis.  He was referred to EP and was started on low-dose beta-blocker and flecainide with improvement in symptoms.  He wore a repeat monitor in 10/2021 that indicated predominant PACs/PVCs, nonsustained SVT (less than 10 seconds) and no atrial fibrillation. He notes history of experiencing presyncope and fatigue last year after starting on flecainide, this resolved later in the year in 2023.  He notes he did not have any episodes while wearing his cardiac monitor in October 2023.  Today he reports nearly daily episodes of presyncope that can last 15 to 20 minutes, they can occur with position changes but can also occur after standing for prolonged period of time.  He endorses a fluttering feeling in his chest during episodes although his Apple Watch indicates sinus rhythm with these episodes.  Following episodes he notes increased fatigue. He denies syncope, chest pain or shortness of breath.  He denies bleeding problems on Eliquis.  EKG today shows sinus rhythm, Qtc 416 ms. Orthostatics were negative. Reviewed ED precautions.  Initially planned on ETT given initiation of flecainide last year, will defer at this time given episodes of presyncope and palpitations. Check CBC, BMET and Mag level today. TSH was normal  in April, 2024.  Will also have him wear a 3-day Zio monitor given frequency of episodes.  Will have close follow-up with the EP for review, ETT can be reconsidered at that time if ZIO monitor is unremarkable. Continue flecainide 75 mg twice daily, Toprol-XL 12.5 mg daily, Eliquis 5 mg twice daily. Orthostatic VS for the past 24 hrs (Last 3 readings):  BP- Lying Pulse- Lying BP- Sitting Pulse- Sitting BP- Standing at 0 minutes Pulse- Standing at 0 minutes BP- Standing at 3 minutes Pulse- Standing at 3 minutes  09/11/22 0952 135/85 60 149/85 62 (!) 151/94 67 158/81 64     Coronary artery  calcifications: Noted to have an elevated coronary calcium score at 1374, 90th percentile, in 05/2021.  He underwent Myoview which showed severe defect at rest worse in the basal inferior and inferoseptal wall, this was felt to not be consistent with ischemia or infarct.  Catheterization was deferred unless he develops symptoms concerning for angina. Today he denies chest pain or shortness of breath.  Question if these elements of presyncope and fatigue, noted above, are possible anginal equivalents.  If Zio is unremarkable and EP feels it is not related to Flecainide use can consider further ischemic workup, likely will need cardiac catheterization. Heart healthy diet encouraged.  Reviewed ED precautions. No ASA given need for Eliquis. Continue losartan 25 mg daily, Toprol XL 12.5 mg daily, and Crestor 20 mg daily.   Hypertension: Initial blood pressure today 144/70, on recheck was 135/85.  Blood pressures at home consistently less than 130/75. Continue Toprol XL 12.5 mg daily and Losartan 25 mg daily.   Hyperlipidemia: Last lipid profile in 05/02/2022 indicated total cholesterol of 112, HDL 38, triglycerides 76 and LDL 58. LDL goal less than 70. Continue Crestor 20 mg daily  Ascending aorta dilation: Echo in 05/2021 indicated mild dilation of a ascending aorta at 40 mm.  Repeat echo 09/2022 borderline dilation of the ascending aorta measuring 37 mm.     Dispo: Follow up with EP at soonest appointment. Follow up with Marjie Skiff, PA or [facility], NP in three months.   Signed, Rip Harbour, NP

## 2022-09-11 ENCOUNTER — Encounter: Payer: Self-pay | Admitting: Student

## 2022-09-11 ENCOUNTER — Ambulatory Visit: Payer: Medicare Other | Attending: Cardiology

## 2022-09-11 ENCOUNTER — Ambulatory Visit: Payer: Medicare Other | Attending: Student | Admitting: Cardiology

## 2022-09-11 ENCOUNTER — Other Ambulatory Visit: Payer: Self-pay | Admitting: Cardiology

## 2022-09-11 VITALS — BP 135/85 | HR 64 | Ht 71.0 in | Wt 232.0 lb

## 2022-09-11 DIAGNOSIS — I10 Essential (primary) hypertension: Secondary | ICD-10-CM | POA: Diagnosis not present

## 2022-09-11 DIAGNOSIS — E782 Mixed hyperlipidemia: Secondary | ICD-10-CM

## 2022-09-11 DIAGNOSIS — R5383 Other fatigue: Secondary | ICD-10-CM

## 2022-09-11 DIAGNOSIS — R931 Abnormal findings on diagnostic imaging of heart and coronary circulation: Secondary | ICD-10-CM

## 2022-09-11 DIAGNOSIS — I493 Ventricular premature depolarization: Secondary | ICD-10-CM

## 2022-09-11 DIAGNOSIS — R002 Palpitations: Secondary | ICD-10-CM | POA: Diagnosis not present

## 2022-09-11 DIAGNOSIS — I480 Paroxysmal atrial fibrillation: Secondary | ICD-10-CM | POA: Diagnosis not present

## 2022-09-11 DIAGNOSIS — R55 Syncope and collapse: Secondary | ICD-10-CM

## 2022-09-11 NOTE — Progress Notes (Unsigned)
Enrolled for [employer] to mail a ZIO XT long term holter monitor to the patients address on file.   Dr. Berry to read. 

## 2022-09-11 NOTE — Patient Instructions (Addendum)
Medication Instructions:  Your physician recommends that you continue on your current medications as directed. Please refer to the Current Medication list given to you today.  *If you need a refill on your cardiac medications before your next appointment, please call your pharmacy*  Lab Work: TODAY: BMET and CBC   If you have labs (blood work) drawn today and your tests are completely normal, you will receive your results only by: MyChart Message (if you have MyChart) OR A paper copy in the mail If you have any lab test that is abnormal or we need to change your treatment, we will call you to review the results.  Testing/Procedures: Your physician has recommended that you wear an event monitor. Event monitors are medical devices that record the heart's electrical activity. Doctors most often [place] these monitors to diagnose arrhythmias. Arrhythmias are problems with the speed or rhythm of the heartbeat. The monitor is a small, portable device. You can wear one while you do your normal daily activities. This is usually used to diagnose what is causing palpitations/syncope (passing out).  Follow-Up: At [facility], you and your health needs are our priority.  As part of our continuing mission to provide you with exceptional heart care, we have created designated Provider Care Teams.  These Care Teams include your primary Cardiologist (physician) and Advanced Practice Providers (APPs -  Physician Assistants and Nurse Practitioners) who all work together to provide you with the care you need, when you need it.   Your next appointment:   3 months  Provider:   Marjie Skiff, PA-C or Reather Littler, NP      Follow up next available with EP   Other Instructions  ZIO XT- Long Term Monitor Instructions  Your physician has requested you wear a ZIO patch monitor for 3 days.  This is a single patch monitor. [employer] supplies one patch monitor per enrollment. Additional stickers are not  available. Please do not apply patch if you will be having a Nuclear Stress Test,  Echocardiogram, Cardiac CT, MRI, or Chest Xray during the period you would be wearing the  monitor. The patch cannot be worn during these tests. You cannot remove and re-apply the  ZIO XT patch monitor.  Your ZIO patch monitor will be mailed 3 day USPS to your address on file. It may take 3-5 days  to receive your monitor after you have been enrolled.  Once you have received your monitor, please review the enclosed instructions. Your monitor  has already been registered assigning a specific monitor serial # to you.  Billing and Patient Assistance Program Information  We have supplied [employer] with any of your insurance information on file for billing purposes. [employer] offers a sliding scale Patient Assistance Program for patients that do not have  insurance, or whose insurance does not completely cover the cost of the ZIO monitor.  You must apply for the Patient Assistance Program to qualify for this discounted rate.  To apply, please call [employer] at [phone], select option 4, select option 2, ask to apply for  Patient Assistance Program. Meredeth Ide will ask your household income, and how many people  are in your household. They will quote your out-of-pocket cost based on that information.  [employer] will also be able to set up a 74-month, interest-free payment plan if needed.  Applying the monitor   Shave hair from upper left chest.  Hold abrader disc by orange tab. Rub abrader in 40 strokes over the upper left chest  as  indicated in your monitor instructions.  Clean area with 4 enclosed alcohol pads. Let dry.  Apply patch as indicated in monitor instructions. Patch will be placed under collarbone on left  side of chest with arrow pointing upward.  Rub patch adhesive wings for 2 minutes. Remove white label marked "1". Remove the white  label marked "2". Rub patch adhesive wings for 2 additional minutes.   While looking in a mirror, press and release button in center of patch. A small green light will  flash 3-4 times. This will be your only indicator that the monitor has been turned on.  Do not shower for the first 24 hours. You may shower after the first 24 hours.  Press the button if you feel a symptom. You will hear a small click. Record Date, Time and  Symptom in the Patient Logbook.  When you are ready to remove the patch, follow instructions on the last 2 pages of Patient  Logbook. Stick patch monitor onto the last page of Patient Logbook.  Place Patient Logbook in the blue and white box. Use locking tab on box and tape box closed  securely. The blue and white box has prepaid postage on it. Please place it in the mailbox as  soon as possible. Your physician should have your test results approximately 7 days after the  monitor has been mailed back to [facility].  Call [facility] Customer Care at [phone] if you have questions regarding  your ZIO XT patch monitor. Call them immediately if you see an orange light blinking on your  monitor.  If your monitor falls off in less than 4 days, contact our Monitor department at [phone].  If your monitor becomes loose or falls off after 4 days call [employer] at [phone] for  suggestions on securing your monitor

## 2022-09-12 LAB — BASIC METABOLIC PANEL
BUN/Creatinine Ratio: 24 (ref 10–24)
BUN: 22 mg/dL (ref 8–27)
CO2: 27 mmol/L (ref 20–29)
Calcium: 9.4 mg/dL (ref 8.6–10.2)
Chloride: 104 mmol/L (ref 96–106)
Creatinine, Ser: 0.91 mg/dL (ref 0.76–1.27)
Glucose: 99 mg/dL (ref 70–99)
Potassium: 5.1 mmol/L (ref 3.5–5.2)
Sodium: 142 mmol/L (ref 134–144)
eGFR: 90 mL/min/{1.73_m2} (ref >59)

## 2022-09-12 LAB — CBC
Hematocrit: 47.2 % (ref 37.5–51.0)
Hemoglobin: 15.2 g/dL (ref 13.0–17.7)
MCH: 29.9 pg (ref 26.6–33.0)
MCHC: 32.2 g/dL (ref 31.5–35.7)
MCV: 93 fL (ref 79–97)
Platelets: 314 10*3/uL (ref 150–450)
RBC: 5.09 x10E6/uL (ref 4.14–5.80)
RDW: 15.3 % (ref 11.6–15.4)
WBC: 6.6 10*3/uL (ref 3.4–10.8)

## 2022-09-12 LAB — MAGNESIUM: Magnesium: 2.1 mg/dL (ref 1.6–2.3)

## 2022-09-14 DIAGNOSIS — I493 Ventricular premature depolarization: Secondary | ICD-10-CM

## 2022-09-14 DIAGNOSIS — R002 Palpitations: Secondary | ICD-10-CM | POA: Diagnosis not present

## 2022-09-14 DIAGNOSIS — R55 Syncope and collapse: Secondary | ICD-10-CM

## 2022-09-14 DIAGNOSIS — R5383 Other fatigue: Secondary | ICD-10-CM | POA: Diagnosis not present

## 2022-09-14 DIAGNOSIS — I480 Paroxysmal atrial fibrillation: Secondary | ICD-10-CM | POA: Diagnosis not present

## 2022-09-21 DIAGNOSIS — R002 Palpitations: Secondary | ICD-10-CM | POA: Diagnosis not present

## 2022-09-21 DIAGNOSIS — I480 Paroxysmal atrial fibrillation: Secondary | ICD-10-CM | POA: Diagnosis not present

## 2022-10-01 ENCOUNTER — Other Ambulatory Visit: Payer: Self-pay | Admitting: Internal Medicine

## 2022-10-03 ENCOUNTER — Other Ambulatory Visit: Payer: Self-pay | Admitting: Cardiovascular Disease

## 2022-10-12 NOTE — Progress Notes (Signed)
Cardiology Office Note:  .   Date:  10/12/2022  ID:  [name], DOB Dec 16, 1950, MRN [id number] PCP: [name], MD  [facility] Providers Cardiologist:  [name], MD { EP: [name]  History of Present Illness: .   Deontra Blasingame. is a 72 y.o. 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   -- coronary calcium scoring which was elevated at 1374, which was 90th percentile for age and sex matched controls. He underwent a Myoview which showed a severe defect at rest worse in the basal inferior inferoseptal wall. This was not felt to be consistent with ischemia or infarct. Cardiac cath was considered but this was deferred   He saw [name] 10/06/21, discussed his nsSVT/NSVT, w/u with Ca score of 1300+ Placed on flecainide with good resolution of palpitations.  C/o fatigue, wondered if it was his BB   He hedl his BB for a short time/few days without improvement and resumed it  -- Repeat monitor in 10/2021 showed predominant sinus rhythm with rare PACs/PVCs, nonsustained SVT lasting less than 10 seconds, and no atrial fibrillation.   Seeing cardiology team a few times since then, of late with frequent episodes of near syncope, feeling tired, some palpitations. Pt mentioned similar symptoms last summer, and wwas trialed a couple days off flecainide without improvement apparently and continued. Symptoms did get better but started back up again recently.   most recently saw cardiology APP 09/11/22, episodes can last as long as 15 minutes , orthostatics were negative,  planned for monitoring, labs, may need cath/new ischemic evaluation, planned to have him see EP post monitor 1st  SR/SB/ST Freq PVCs (4% burden)  Short runs of SVT/NSVT Needs ROV with me or an APP to discuss   Today's visit is scheduled to f/u on palpitations, monitor  ROS:   He reports:  Generally never really feeling well, tired all of the time, no energy Often if not most days about 1100 for a few hours a clear slump in his energy with sense of feeling lightheaded as well, lasts a few hours and then starts to get back to his baseline Near syncope, these are more random, and a sense of a few seconds or so that he is about to faint and then quickly gone, not particularly position or upon stading, but thinks only happens when up on his feet  --He notes that when his BP gets towards the 110's he feels worese and gets there fairly regularly and even low 100s as well.  Never findds it high --He reports that all his life he has been pretty sensitive to medications and thinks symptoms may be part of that -- he has not felt any Afib  Symptom episodes on his monitor associate with PVCs, trigemeny mostly, though some symptom episodes with SR 90's, rare ectopy He is having day in and day out symptoms Ectopy burden was  1% supraventricular,  and 3.8% ventricular Avg HR 69, min was 48 at 04:46am  In my personal review No QRS widening or AV  block  No CP No SOB No bleeding    Arrhythmia/AAD hx NSSVT, NSVTs Flecainide started June 2023  Studies Reviewed: Marland Kitchen    EKG donr today and reviewed by myself SR 63bpm, PR , QRS 96ms, QTc  Risk Assessment/Calculations:    Physical Exam:   VS:  There were no vitals taken for this visit.   Wt Readings from Last 3 Encounters:  09/11/22 232 lb (105.2 kg)  03/14/22 226 lb 3.2 oz (102.6 kg)  10/06/21 215 lb (97.5 kg)    GEN: Well nourished, well developed in no acute distress NECK: No JVD; No carotid bruits CARDIAC: RRR, no ectopy  appreciated today with prolonged auscultation, no murmurs, rubs, gallops RESPIRATORY:  CTA b/l without rales, wheezing or rhonchi  ABDOMEN: Soft, non-tender, non-distended EXTREMITIES:  No edema; No deformity     ASSESSMENT AND PLAN: .    NS-SVTs NSVT PVCs Paroxysmal AFib CHA2DS2Vasc is 3, on Eliquis, appropriately dosed Low ectopy burden No AFib On flecainide with Toprol, stable intervals  Coronary Ca, stress test apparently not fel to represent any ischemic heart disease in review of notes, "diaphragmatic attenuation without ischemia" by Dr. Allyson Sabal. Will make no changes at this time to his AAD and d/w [name]   5.  Fatigue 6.  Weakness 7.  Lightheaded, near syncope  I do not think his fatigue, day in/day out symptoms can be attributed to HR, rhythm He does get some lower BPs at home that he correlates with worse symptoms And predictably so about 1100 AM for a few hours. Will stop his losartan, he will monitor his BP and let us know if his BP creeps up/gets high   Momentary near syncope Not seated or standing, he does say that this is somewhat seasonal, similar last fall and perhaps associated with sinuses/ears, when his allergies are flared up like they are now.  I have advised he discuss with his PMD as well his symptoms for any non-cardiac evaluation/labs.  8.  Secondary hypercoagulable state    Dispo: back in  a month or so off ARB, sooner if needed  Signed, Sheilah Pigeon, PA-C

## 2022-10-13 ENCOUNTER — Ambulatory Visit: Payer: Medicare Other | Attending: Physician Assistant | Admitting: Physician Assistant

## 2022-10-13 ENCOUNTER — Encounter: Payer: Self-pay | Admitting: Physician Assistant

## 2022-10-13 VITALS — BP 134/78 | HR 63 | Ht 71.0 in | Wt 235.0 lb

## 2022-10-13 DIAGNOSIS — I4729 Other ventricular tachycardia: Secondary | ICD-10-CM

## 2022-10-13 DIAGNOSIS — D6869 Other thrombophilia: Secondary | ICD-10-CM

## 2022-10-13 DIAGNOSIS — I471 Supraventricular tachycardia, unspecified: Secondary | ICD-10-CM | POA: Diagnosis not present

## 2022-10-13 DIAGNOSIS — I480 Paroxysmal atrial fibrillation: Secondary | ICD-10-CM

## 2022-10-13 DIAGNOSIS — I10 Essential (primary) hypertension: Secondary | ICD-10-CM

## 2022-10-13 NOTE — Patient Instructions (Addendum)
Medication Instructions:   Your physician recommends that you continue on your current medications as directed. Please refer to the Current Medication list given to you today.  ( MESSAGE SENT TO MYCHART FOR  STOPPING LOSARTAN)  *If you need a refill on your cardiac medications before your next appointment, please call your pharmacy*   Lab Work:  NONE ORDERED  TODAY   If you have labs (blood work) drawn today and your tests are completely normal, you will receive your results only by: MyChart Message (if you have MyChart) OR A paper copy in the mail If you have any lab test that is abnormal or we need to change your treatment, we will call you to review the results.   Testing/Procedures: NONE ORDERED  TODAY     Follow-Up: At [facility], you and your health needs are our priority.  As part of our continuing mission to provide you with exceptional heart care, we have created designated Provider Care Teams.  These Care Teams include your primary Cardiologist (physician) and Advanced Practice Providers (APPs -  Physician Assistants and Nurse Practitioners) who all work together to provide you with the care you need, when you need it.  We recommend signing up for the patient portal called "MyChart".  Sign up information is provided on this After Visit Summary.  MyChart is used to connect with patients for Virtual Visits (Telemedicine).  Patients are able to view lab/test results, encounter notes, upcoming appointments, etc.  Non-urgent messages can be sent to your provider as well.   To learn more about what you can do with MyChart, go to [email or web address].    Your next appointment:   1 month(s)  Provider:   You may see Dr Ladona Ridgel  ONLY   But if nothing available  or Francis Dowse, PA-C   Other Instructions

## 2022-11-12 NOTE — Progress Notes (Unsigned)
Cardiology Office Note:  .   Date:  11/12/2022  ID:  Donald Mow., DOB [birth year], MRN [id number] PCP: [name], MD  [facility] Providers Cardiologist:  [name], MD { EP: [name]  History of Present Illness: .   Donald Tache. is a 72 y.o. 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   -- coronary calcium scoring which was elevated at 1374, which was 90th percentile for age and sex matched controls. He underwent a Myoview which showed a severe defect at rest worse in the basal inferior inferoseptal wall. This was not felt to be consistent with ischemia or infarct. Cardiac cath was considered but this was deferred   He saw [name] 10/06/21, discussed his nsSVT/NSVT, w/u with Ca score of 1300+ Placed on flecainide with good resolution of palpitations.  C/o fatigue, wondered if it was his BB   He held his BB for a short time/few days without improvement and resumed it  -- Repeat monitor in 10/2021 showed predominant sinus rhythm with rare PACs/PVCs, nonsustained SVT lasting less than 10 seconds, and no atrial fibrillation.   Seeing cardiology team a few times since then, of late with frequent episodes of near syncope, feeling tired, some palpitations. Pt mentioned similar symptoms last summer, and wwas trialed a couple days off flecainide without improvement apparently and continued. Symptoms did get better but started back up again recently.   most recently saw cardiology APP 09/11/22, episodes can last as long as 15 minutes , orthostatics were negative,  planned for monitoring, labs, may need cath/new ischemic evaluation, planned to have him see EP post monitor 1st  SR/SB/ST Freq PVCs (4% burden)  Short runs of SVT/NSVT Needs ROV with me or an APP to discuss  I saw him 10/13/22 He reported: Generally never really feeling well, tired all of the time, no energy Often if not most days about 1100 for a few hours a clear slump in his energy with sense of feeling lightheaded as well, lasts a few hours and then starts to get back to his baseline Near syncope, these are more random, and a sense of a few seconds or so that he is about to faint and then quickly gone, not particularly position or upon stading, but thinks only happens when up on his feet  --He notes that when his BP gets towards the 110's he feels worese and gets there fairly regularly and even low 100s as well.  Never findds it high --He reports that all his life he has been pretty sensitive to medications and thinks symptoms may be part of that -- he has not felt any Afib  Symptom episodes on his monitor associate with PVCs, trigemeny mostly, though some symptom episodes with SR 90's, rare ectopy He is having day in and day out symptoms Ectopy burden was  1% supraventricular,  and 3.8% ventricular Avg HR 69, min was 48 at 04:46am In my personal review No QRS widening or AV block No CP No SOB No bleeding His losartan stopped given symptoms  seemed to line up timing of drug   Today's visit is scheduled for 718mo f/u  ROS:   He is feeling better Not perfect but better He didn't notice any large improvement off the losartan bu t his BP did climb up and resumed it. He continues to have a late morning/mid-day slump, but no near syncope, syncope No CP He will feel fleeting palpitations at night when 1st in bed, none otherwise No bleeding or signs of bleeding  Arrhythmia/AAD hx AFib, NS-SVT, NSVTs Flecainide started June 2023  Studies Reviewed: Marland Kitchen    EKG done today and  reviewed by myself SR 67bpm, , LAD, PR , QRS , QTc  10/13/22: SR 63bpm, PR , QRS 96ms, QTc  Risk Assessment/Calculations:    Physical Exam:   VS:  There were no vitals taken for this visit.   Wt Readings from Last 3 Encounters:  10/13/22 235 lb (106.6 kg)  09/11/22 232 lb (105.2 kg)  03/14/22 226 lb 3.2 oz (102.6 kg)    GEN: Well nourished, well developed in no acute distress NECK: No JVD; No carotid bruits CARDIAC:  RRR, no ectopy appreciated today with prolonged auscultation, no murmurs, rubs, gallops RESPIRATORY: CTA b/l without rales, wheezing or rhonchi  ABDOMEN: Soft, non-tender, non-distended EXTREMITIES: No edema; No deformity     ASSESSMENT AND PLAN: .    NS-SVTs NSVT PVCs Paroxysmal AFib CHA2DS2Vasc is 3, on Eliquis, appropriately dosed Minimal/fleeting palpitations On flecainide with Toprol, stable intervals  Coronary Ca, stress test apparently not felt to represent any ischemic heart disease in review of notes, "diaphragmatic attenuation without ischemia" by Dr. Allyson Sabal. Will make no changes at this time to his AAD and d/w [name]   5.  Fatigue 6.  Weakness 7.  Lightheaded, near syncope Improved Encouraged increased physical activity to his ability F/u with him PMD  8.  Secondary hypercoagulable state   9. HTN No changes today   Dispo: continue with general cardiology team, back with EP in 18mo, sooner if needed  Signed, Sheilah Pigeon, PA-C

## 2022-11-14 ENCOUNTER — Encounter: Payer: Self-pay | Admitting: Physician Assistant

## 2022-11-14 ENCOUNTER — Ambulatory Visit: Payer: Medicare Other | Attending: Physician Assistant | Admitting: Physician Assistant

## 2022-11-14 VITALS — BP 140/82 | HR 67 | Ht 71.0 in | Wt 235.0 lb

## 2022-11-14 DIAGNOSIS — T462X5D Adverse effect of other antidysrhythmic drugs, subsequent encounter: Secondary | ICD-10-CM | POA: Diagnosis not present

## 2022-11-14 DIAGNOSIS — D6869 Other thrombophilia: Secondary | ICD-10-CM | POA: Diagnosis not present

## 2022-11-14 DIAGNOSIS — I480 Paroxysmal atrial fibrillation: Secondary | ICD-10-CM

## 2022-11-14 DIAGNOSIS — I471 Supraventricular tachycardia, unspecified: Secondary | ICD-10-CM | POA: Diagnosis not present

## 2022-11-14 DIAGNOSIS — I493 Ventricular premature depolarization: Secondary | ICD-10-CM

## 2022-11-14 DIAGNOSIS — I10 Essential (primary) hypertension: Secondary | ICD-10-CM | POA: Diagnosis not present

## 2022-11-14 DIAGNOSIS — T462X5A Adverse effect of other antidysrhythmic drugs, initial encounter: Secondary | ICD-10-CM

## 2022-11-14 NOTE — Patient Instructions (Signed)
Medication Instructions:  Your physician recommends that you continue on your current medications as directed. Please refer to the Current Medication list given to you today.  *If you need a refill on your cardiac medications before your next appointment, please call your pharmacy*   Lab Work: NONE If you have labs (blood work) drawn today and your tests are completely normal, you will receive your results only by: MyChart Message (if you have MyChart) OR A paper copy in the mail If you have any lab test that is abnormal or we need to change your treatment, we will call you to review the results.   Testing/Procedures: EKG   Follow-Up: At [facility], you and your health needs are our priority.  As part of our continuing mission to provide you with exceptional heart care, we have created designated Provider Care Teams.  These Care Teams include your primary Cardiologist (physician) and Advanced Practice Providers (APPs -  Physician Assistants and Nurse Practitioners) who all work together to provide you with the care you need, when you need it.  We recommend signing up for the patient portal called "MyChart".  Sign up information is provided on this After Visit Summary.  MyChart is used to connect with patients for Virtual Visits (Telemedicine).  Patients are able to view lab/test results, encounter notes, upcoming appointments, etc.  Non-urgent messages can be sent to your provider as well.   To learn more about what you can do with MyChart, go to [email or web address].    Your next appointment:   4 month(s)  Provider:   Francis Dowse, PA-C

## 2022-12-11 NOTE — Progress Notes (Unsigned)
Cardiology Office Note    Date:  12/11/2022  ID:  Donald Enz., DOB April 13, 1950, MRN [id number] PCP:  [name], MD  Cardiologist:  [name], MD  Electrophysiologist:  None   Chief Complaint: ***  History of Present Illness: .    Donald Balandran. is a 72 y.o. male with visit-pertinent history of coronary artery calcification with elevated coronary calcium score of 1374 in May 2023, paroxysmal A-fib, paroxysmal SVT and NSVT, HTN, HLD.  He is followed by Dr. Allyson Sabal. He presents today for routine follow up.    Donald Norman was referred back to Dr. Allyson Sabal in 02/2021, he had last been seen by cardiology in 2014.  He reported 15 episodes of palpitations during the summer of 07/2020 which were captured on his Apple Watch.  Reported that he decreased his caffeine intake with resolution of symptoms.  He wore a ZIO monitor that showed predominantly sinus rhythm with frequent PVCs, occasional PACs, 24 short runs of NSVT (longest 7 beats), 225 runs of SVT, (longest 17.8 seconds, and runs of PAF with less than 1% burden, he was started on Eliquis.  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  He underwent coronary calcium scoring which was elevated at 1374, which was 90th percentile for age and sex matched controls.  He underwent a Myoview which showed a severe defect at rest worse in the basal inferior inferoseptal wall.  This was not felt to be consistent with ischemia or infarct.  Cardiac cath was considered but this was deferred unless he develops symptoms concerning for angina.  For further evaluation of his palpitations arrhythmia he was referred to EP who started him on flecainide in 10/2021.  Repeat monitor in 10/2021 showed predominant sinus rhythm with rare PACs/PVCs, nonsustained SVT lasting less than 10 seconds, and no atrial fibrillation.   He was seen  by Dr. Allyson Sabal in 03/2022, he had remained stable from a cardiac perspective.  He underwent repeat echocardiogram on 09/07/2022 that indicated the EF of 60 to 65%, LV with normal function, no RWMA, G1 DD, RV systolic function was normal with normal size, borderline dilation of the ascending aorta was noted at 37 mm.  At last office visit on 09/11/2022 he reported increased episodes of presyncope and fatigue that were crawling nearly daily.  He reported episodes could last 15 to 30 minutes then felt fatigued the rest of the day.  He endorsed a slight fluttering in his chest with these episodes although he would check his Apple Watch which indicated sinus rhythm with these events.  He wore a 3-day cardiac monitor which showed an average heart rate of 69 bpm, ranging from 48 to 184 bpm.  Predominant underlying rhythm was sinus rhythm, he had 7 runs of ventricular tachycardia, 16 runs of supraventricular tachycardia run with the fastest interval lasting 4 beats with a max rate of 194 bpm, isolated VE's were occasional at 3.9%.  He was evaluated in EP on 10/13/2022 with discontinuation of losartan as it was questioned if this was possibly causing his fatigue.  On his follow-up on 11/14/2022, he reported that he was feeling better although he did need to restart his losartan as his blood pressure increased.  Today he presents for follow-up.  He reports that he  Presyncope/fatigue/Paroxysmal SVT/afib: Monitor in /2023 showed predominantly sinus rhythm with frequent PVCs, occasional PACs,  24 short runs of NSVT (longest 7 beats), 225 runs of SVT, (longest 17.8 seconds, and runs of PAF with less than 1% burden, he was started on Eliquis.  He was referred to EP and was started on low-dose beta-blocker and flecainide with improvement in symptoms.  He wore a repeat monitor in 10/2021 that indicated predominant PACs/PVCs, nonsustained SVT (less than 10 seconds) and no atrial fibrillation. He notes history of experiencing  presyncope and fatigue last year after starting on flecainide, this resolved later in the year in 2023.  He notes he did not have any episodes while wearing his cardiac monitor in October 2023. Repeat cardiac monitor in 09/2022 showed an average heart rate of 69 bpm, ranging from 48 to 184 bpm.  Predominant underlying rhythm was sinus rhythm, he had 7 runs of ventricular tachycardia, 16 runs of supraventricular tachycardia run with the fastest interval lasting 4 beats with a max rate of 194 bpm, isolated VE's were occasional at 3.9%. Today he reports   Coronary artery calcifications: Noted to have an elevated coronary calcium score at 1374, 90th percentile, in 05/2021.  He underwent Myoview which showed severe defect at rest worse in the basal inferior and inferoseptal wall, this was felt to not be consistent with ischemia or infarct.  Catheterization was deferred unless he develops symptoms concerning for angina.  Today he reports   Heart healthy diet encouraged.  Reviewed ED precautions. No ASA given need for Eliquis. Continue losartan 25 mg daily, Toprol XL 12.5 mg daily, and Crestor 20 mg daily.    Hypertension: Initial blood pressure today Continue Toprol-XL 12.5 mg daily and losartan 25 mg daily  Hyperlipidemia: Last lipid profile in 05/02/2022 indicated total cholesterol of 112, HDL 38, triglycerides 76 and LDL 58. LDL goal less than 70. Continue Crestor 20 mg daily  Ascending aorta dilation: Echo in 05/2021 indicated mild dilation of a ascending aorta at 40 mm.  Repeat echo 09/2022 borderline dilation of the ascending aorta measuring 37 mm.  Labwork independently reviewed:   ROS: .   *** denies chest pain, shortness of breath, lower extremity edema, fatigue, palpitations, melena, hematuria, hemoptysis, diaphoresis, weakness, presyncope, syncope, orthopnea, and PND.  All other systems are reviewed and otherwise negative.  Studies Reviewed: Marland Kitchen    EKG:  EKG is ordered today, personally  reviewed, demonstrating ***  CV Studies:  Cardiac Studies & Procedures     STRESS TESTS  MYOCARDIAL PERFUSION IMAGING 05/26/2021  Narrative   Findings are equivocal. The study is intermediate risk.   No ST deviation was noted.   LV perfusion is equivocal. There is no evidence of ischemia.   Left ventricular function is normal. End diastolic cavity size is mildly enlarged. End systolic cavity size is normal.   Prior study not available for comparison.  There is a severe defect at rest, worst in basal inferior/inferoseptal wall with gradual improvement approaching the apex. This is improved with stress. There is normal wall motion in this area. There is no clear diaphragmatic attenuation or extracardiac activity in the area that supports artifact, but pattern is not consistent with ischemia/infarct either. Overall equivocal. Size of defect makes study intermediate risk. Would consider alternative evaluation for ischemia.   ECHOCARDIOGRAM  ECHOCARDIOGRAM COMPLETE 09/07/2022  Narrative ECHOCARDIOGRAM REPORT    Patient Name:   Donald Norman. Date of Exam: 09/07/2022 Medical Rec #:  [id number]      Height:       71.0 in Accession #:    [id number]  Weight:       226.2 lb Date of Birth:  Sep 08, 1950      BSA:          2.222 m Patient Age:    72 years       BP:           132/72 mmHg Patient Gender: M              HR:           65 bpm. Exam Location:  [address]  Procedure: 2D Echo, Cardiac Doppler and Color Doppler  Indications:    I10 Hypertension  History:        Patient has prior history of Echocardiogram examinations, most recent 05/05/2021. Arrythmias:PVC and Atrial Fibrillation, Signs/Symptoms:Murmur; Risk Factors:Sleep Apnea and Dyslipidemia. Elevated coronary artery calcium score. Atrial tachycardia. Aneurysm of ascending aorta without rupture.  Sonographer:    Cathie Beams RCS Referring Phys: [phone] JONATHAN J BERRY  IMPRESSIONS   1. Left ventricular ejection fraction,  by estimation, is 60 to 65%. The left ventricle has normal function. The left ventricle has no regional wall motion abnormalities. Left ventricular diastolic parameters are consistent with Grade I diastolic dysfunction (impaired relaxation). 2. Right ventricular systolic function is normal. The right ventricular size is normal. There is normal pulmonary artery systolic pressure. The estimated right ventricular systolic pressure is 22.7 mmHg. 3. The mitral valve is normal in structure. Trivial mitral valve regurgitation. No evidence of mitral stenosis. 4. The aortic valve is tricuspid. Aortic valve regurgitation is not visualized. No aortic stenosis is present. 5. Aortic dilatation noted. There is borderline dilatation of the ascending aorta, measuring 37 mm. 6. The inferior vena cava is normal in size with greater than 50% respiratory variability, suggesting right atrial pressure of 3 mmHg.  FINDINGS Left Ventricle: Left ventricular ejection fraction, by estimation, is 60 to 65%. The left ventricle has normal function. The left ventricle has no regional wall motion abnormalities. The left ventricular internal cavity size was normal in size. There is no left ventricular hypertrophy. Left ventricular diastolic parameters are consistent with Grade I diastolic dysfunction (impaired relaxation).  Right Ventricle: The right ventricular size is normal. No increase in right ventricular wall thickness. Right ventricular systolic function is normal. There is normal pulmonary artery systolic pressure. The tricuspid regurgitant velocity is 2.22 m/s, and with an assumed right atrial pressure of 3 mmHg, the estimated right ventricular systolic pressure is 22.7 mmHg.  Left Atrium: Left atrial size was normal in size.  Right Atrium: Right atrial size was normal in size.  Pericardium: There is no evidence of pericardial effusion.  Mitral Valve: The mitral valve is normal in structure. Trivial mitral valve  regurgitation. No evidence of mitral valve stenosis.  Tricuspid Valve: The tricuspid valve is normal in structure. Tricuspid valve regurgitation is trivial.  Aortic Valve: The aortic valve is tricuspid. Aortic valve regurgitation is not visualized. No aortic stenosis is present.  Pulmonic Valve: The pulmonic valve was normal in structure. Pulmonic valve regurgitation is not visualized.  Aorta: The aortic root is normal in size and structure and aortic dilatation noted. There is borderline dilatation of the ascending aorta, measuring 37 mm.  Venous: The inferior vena cava is normal in size with greater than 50% respiratory variability, suggesting right atrial pressure of 3 mmHg.  IAS/Shunts: No atrial level shunt detected by color flow Doppler.   LEFT VENTRICLE PLAX 2D LVIDd:         4.50 cm   Diastology  LVIDs:         2.30 cm   LV e' medial:    8.27 cm/s LV PW:         0.90 cm   LV E/e' medial:  9.2 LV IVS:        1.10 cm   LV e' lateral:   11.20 cm/s LVOT diam:     2.00 cm   LV E/e' lateral: 6.8 LV SV:         73 LV SV Index:   33 LVOT Area:     3.14 cm   RIGHT VENTRICLE RV Basal diam:  3.70 cm RV S prime:     13.40 cm/s TAPSE (M-mode): 2.9 cm RVSP:           22.7 mmHg  LEFT ATRIUM             Index        RIGHT ATRIUM           Index LA diam:        3.70 cm 1.67 cm/m   RA Pressure: 3.00 mmHg LA Vol (A2C):   39.3 ml 17.69 ml/m  RA Area:     15.20 cm LA Vol (A4C):   41.1 ml 18.50 ml/m  RA Volume:   32.80 ml  14.76 ml/m LA Biplane Vol: 42.4 ml 19.08 ml/m AORTIC VALVE LVOT Vmax:   106.00 cm/s LVOT Vmean:  69.400 cm/s LVOT VTI:    0.231 m  AORTA Ao Root diam: 3.30 cm Ao Asc diam:  3.70 cm  MITRAL VALVE               TRICUSPID VALVE MV Area (PHT): 3.46 cm    TR Peak grad:   19.7 mmHg MV Decel Time: 219 msec    TR Vmax:        222.00 cm/s MV E velocity: 76.00 cm/s  Estimated RAP:  3.00 mmHg MV A velocity: 93.00 cm/s  RVSP:           22.7 mmHg MV E/A ratio:   0.82 SHUNTS Systemic VTI:  0.23 m Systemic Diam: 2.00 cm  Dalton McleanMD Electronically signed by Wilfred Lacy Signature Date/Time: 09/07/2022/10:19:01 AM    Final    MONITORS  LONG TERM MONITOR (3-14 DAYS) 09/21/2022  Narrative Patch Wear Time:  3 days and 3 hours (2024-09-12T16:26:30-0400 to 2024-09-15T19:59:48-0400)  Patient had a min HR of 48 bpm, max HR of 184 bpm, and avg HR of 69 bpm. Predominant underlying rhythm was Sinus Rhythm. 7 Ventricular Tachycardia runs occurred, the run with the fastest interval lasting 7 beats with a max rate of 152 bpm (avg 105 bpm); the run with the fastest interval was also the longest. 16 Supraventricular Tachycardia runs occurred, the run with the fastest interval lasting 4 beats with a max rate of 184 bpm, the longest lasting 10 beats with an avg rate of 122 bpm. Isolated SVEs were rare (<1.0%), SVE Couplets were rare (<1.0%), and SVE Triplets were rare (<1.0%). Isolated VEs were occasional (3.9%, 12200), VE Couplets were rare (<1.0%, 306), and VE Triplets were rare (<1.0%, 28). Ventricular Bigeminy and Trigeminy were present  SR/SB/ST Freq PVCs (4% burden) Short runs of SVT/NSVT Needs ROV with me or an APP to discuss   CT SCANS  CT CARDIAC SCORING (SELF PAY ONLY) 05/05/2021  Addendum 05/05/2021  7:05 PM ADDENDUM REPORT: 05/05/2021 19:02  ADDENDUM: Cardiovascular Disease Risk stratification  EXAM: Coronary Calcium Score  TECHNIQUE: A gated, non-contrast computed tomography  scan of the heart was performed using 3mm slice thickness. Axial images were analyzed on a dedicated workstation. Calcium scoring of the coronary arteries was performed using the Agatston method.  FINDINGS: Coronary arteries: Normal origins.  Coronary Calcium Score:  Left main: 0  Left anterior descending artery: 374  Left circumflex artery: 389  Right coronary artery: 610  Total: 1374  Percentile: 90  Pericardium: Normal.  Ascending Aorta:  Normal caliber.  Non-cardiac: See separate report from [facility].  IMPRESSION: Coronary calcium score of 1374. This was 90 percentile for age-, race-, and sex-matched controls.  RECOMMENDATIONS: Coronary artery calcium (CAC) score is a strong predictor of incident coronary heart disease (CHD) and provides predictive information beyond traditional risk factors. CAC scoring is reasonable to use in the decision to withhold, postpone, or initiate statin therapy in intermediate-risk or selected borderline-risk asymptomatic adults (age 63-75 years and LDL-C >=70 to <190 mg/dL) who do not have diabetes or established atherosclerotic cardiovascular disease (ASCVD).* In intermediate-risk (10-year ASCVD risk >=7.5% to <20%) adults or selected borderline-risk (10-year ASCVD risk >=5% to <7.5%) adults in whom a CAC score is measured for the purpose of making a treatment decision the following recommendations have been made:  If CAC=0, it is reasonable to withhold statin therapy and reassess in 5 to 10 years, as long as higher risk conditions are absent (diabetes mellitus, family history of premature CHD in first degree relatives (males <55 years; females <65 years), cigarette smoking, or LDL >=190 mg/dL).  If CAC is 1 to 99, it is reasonable to initiate statin therapy for patients >=64 years of age.  If CAC is >=100 or >=75th percentile, it is reasonable to initiate statin therapy at any age.  Cardiology referral should be considered for patients with CAC scores >=400 or >=75th percentile.  *2018 AHA/ACC/AACVPR/AAPA/ABC/ACPM/ADA/AGS/APhA/ASPC/NLA/PCNA Guideline on the Management of Blood Cholesterol: A Report of the American College of Cardiology/American Heart Association Task Force on Clinical Practice Guidelines. J Am Coll Cardiol. 2019;73(24):3168-3209.  Thomasene Ripple, DO [facility]  The noncardiac portion of this study will be interpreted in separate report by the  radiologist.   Electronically Signed By: Thomasene Ripple D.O. On: 05/05/2021 19:02  Narrative CLINICAL DATA:  This over-read does not include interpretation of cardiac or coronary anatomy or pathology. The coronary calcium score interpretation by the cardiologist is attached.  COMPARISON:  None Available.  FINDINGS: Vascular: Heart is normal size. Aorta normal caliber. Scattered calcifications in the aortic root and arch.  Mediastinum/Nodes: No adenopathy  Lungs/Pleura: Areas of scarring in the lung bases. Elevation of the right hemidiaphragm. No acute confluent opacities or effusions.  Upper Abdomen: Small gallstone layering within the gallbladder. Scattered low-density lesions in the liver measuring up to 3 cm most compatible with cysts. No acute findings.  Musculoskeletal: Chest wall soft tissues are unremarkable. No acute bony abnormality.  IMPRESSION: Scattered aortic atherosclerosis.  Cholelithiasis.  Scattered hepatic cysts.  No acute extra cardiac abnormality.  Electronically Signed: By: Charlett Nose M.D. On: 05/05/2021 10:58            Current Reported Medications:.    No outpatient medications have been marked as taking for the 12/12/22 encounter (Appointment) with Rip Harbour, NP.    Physical Exam:    VS:  There were no vitals taken for this visit.   Wt Readings from Last 3 Encounters:  11/14/22 235 lb (106.6 kg)  10/13/22 235 lb (106.6 kg)  09/11/22 232 lb (105.2 kg)    GEN: Well  nourished, well developed in no acute distress NECK: No JVD; No carotid bruits CARDIAC: ***RRR, no murmurs, rubs, gallops RESPIRATORY:  Clear to auscultation without rales, wheezing or rhonchi  ABDOMEN: Soft, non-tender, non-distended EXTREMITIES:  No edema; No acute deformity   Asessement and Plan:.     ***     Disposition: F/u with ***  Signed, Rip Harbour, NP

## 2022-12-12 ENCOUNTER — Encounter: Payer: Self-pay | Admitting: Cardiology

## 2022-12-12 ENCOUNTER — Ambulatory Visit: Payer: Medicare Other | Attending: Cardiology | Admitting: Cardiology

## 2022-12-12 VITALS — BP 138/62 | HR 69 | Ht 72.0 in | Wt 235.0 lb

## 2022-12-12 DIAGNOSIS — I480 Paroxysmal atrial fibrillation: Secondary | ICD-10-CM

## 2022-12-12 DIAGNOSIS — E782 Mixed hyperlipidemia: Secondary | ICD-10-CM

## 2022-12-12 DIAGNOSIS — I10 Essential (primary) hypertension: Secondary | ICD-10-CM

## 2022-12-12 DIAGNOSIS — I493 Ventricular premature depolarization: Secondary | ICD-10-CM | POA: Diagnosis not present

## 2022-12-12 DIAGNOSIS — I471 Supraventricular tachycardia, unspecified: Secondary | ICD-10-CM | POA: Diagnosis not present

## 2022-12-12 DIAGNOSIS — Z79899 Other long term (current) drug therapy: Secondary | ICD-10-CM

## 2022-12-12 DIAGNOSIS — Z5181 Encounter for therapeutic drug level monitoring: Secondary | ICD-10-CM

## 2022-12-12 DIAGNOSIS — R931 Abnormal findings on diagnostic imaging of heart and coronary circulation: Secondary | ICD-10-CM

## 2022-12-12 DIAGNOSIS — D6869 Other thrombophilia: Secondary | ICD-10-CM | POA: Diagnosis not present

## 2022-12-12 NOTE — Patient Instructions (Signed)
Medication Instructions:  Hold Metoprolol the day for the Exercise Stress test *If you need a refill on your cardiac medications before your next appointment, please call your pharmacy*  Lab Work: No labs  Testing/Procedures: Your physician has requested that you have an Exercise Stress Test. An exercise tolerance test is a test to check how your heart works during exercise. You will need to walk on a treadmill for this test. An electrocardiogram (ECG) will record your heartbeat when you are at rest and when you are exercising.    Please arrive 15 minutes prior to your appointment time for registration and insurance purposes.   The test will take approximately 45 minutes to complete.   How to prepare for your Exercise Stress Test: Do bring a list of your current medications with you.  If not listed below, you may take your medications as normal. Do wear comfortable clothes (no dresses or overalls) and walking shoes, tennis shoes preferred (no heels or open toed shoes are allowed) Do Not wear cologne, perfume, aftershave or lotions (deodorant is allowed). Do not drink or eat foods with caffeine for 24 hours before the test. (Chocolate, coffee, tea, or energy drinks) If you use an inhaler, bring it with you to the test. Do not smoke for 4 hours before the test.    If these instructions are not followed, your test will have to be rescheduled.   If you cannot keep your appointment, please provide 24 hours notification to our office, to avoid a possible $50 charge to your account.  Follow-Up: At [facility], you and your health needs are our priority.  As part of our continuing mission to provide you with exceptional heart care, we have created designated Provider Care Teams.  These Care Teams include your primary Cardiologist (physician) and Advanced Practice Providers (APPs -  Physician Assistants and Nurse Practitioners) who all work together to provide you with the care you need,  when you need it.  We recommend signing up for the patient portal called "MyChart".  Sign up information is provided on this After Visit Summary.  MyChart is used to connect with patients for Virtual Visits (Telemedicine).  Patients are able to view lab/test results, encounter notes, upcoming appointments, etc.  Non-urgent messages can be sent to your provider as well.   To learn more about what you can do with MyChart, go to [email or web address].    Your next appointment:   3 month(s)  Provider:   [name], MD

## 2022-12-13 DIAGNOSIS — L280 Lichen simplex chronicus: Secondary | ICD-10-CM | POA: Diagnosis not present

## 2022-12-13 DIAGNOSIS — L209 Atopic dermatitis, unspecified: Secondary | ICD-10-CM | POA: Diagnosis not present

## 2023-01-04 ENCOUNTER — Ambulatory Visit: Payer: Medicare Other | Attending: Cardiology

## 2023-01-04 DIAGNOSIS — I10 Essential (primary) hypertension: Secondary | ICD-10-CM | POA: Diagnosis not present

## 2023-01-04 DIAGNOSIS — I471 Supraventricular tachycardia, unspecified: Secondary | ICD-10-CM

## 2023-01-04 DIAGNOSIS — Z5181 Encounter for therapeutic drug level monitoring: Secondary | ICD-10-CM

## 2023-01-04 DIAGNOSIS — R931 Abnormal findings on diagnostic imaging of heart and coronary circulation: Secondary | ICD-10-CM

## 2023-01-04 DIAGNOSIS — D6869 Other thrombophilia: Secondary | ICD-10-CM

## 2023-01-04 DIAGNOSIS — I480 Paroxysmal atrial fibrillation: Secondary | ICD-10-CM | POA: Diagnosis not present

## 2023-01-04 DIAGNOSIS — E782 Mixed hyperlipidemia: Secondary | ICD-10-CM

## 2023-01-04 DIAGNOSIS — Z79899 Other long term (current) drug therapy: Secondary | ICD-10-CM

## 2023-01-04 DIAGNOSIS — I493 Ventricular premature depolarization: Secondary | ICD-10-CM

## 2023-01-04 LAB — EXERCISE TOLERANCE TEST
Angina Index: 0
Duke Treadmill Score: 7
Estimated workload: 8.5
Exercise duration (min): 7 min
Exercise duration (sec): 0 s
MPHR: 148 {beats}/min
Peak HR: 133 {beats}/min
Percent HR: 89 %
RPE: 17
Rest HR: 74 {beats}/min
ST Depression (mm): 0 mm

## 2023-01-05 ENCOUNTER — Other Ambulatory Visit: Payer: Self-pay | Admitting: Cardiovascular Disease

## 2023-01-10 ENCOUNTER — Telehealth: Payer: Self-pay

## 2023-01-10 NOTE — Telephone Encounter (Signed)
 Called patient advised of below they verbalized understanding.

## 2023-01-10 NOTE — Telephone Encounter (Signed)
-----   Message from [facility] sent at 01/08/2023  9:25 PM EST ----- Please let [name] know that his ETT was normal, there were frequent PVCs. Continue current medications and follow up as planned.

## 2023-01-11 DIAGNOSIS — R948 Abnormal results of function studies of other organs and systems: Secondary | ICD-10-CM | POA: Diagnosis not present

## 2023-01-18 DIAGNOSIS — E349 Endocrine disorder, unspecified: Secondary | ICD-10-CM | POA: Diagnosis not present

## 2023-01-26 DIAGNOSIS — L209 Atopic dermatitis, unspecified: Secondary | ICD-10-CM | POA: Diagnosis not present

## 2023-03-11 NOTE — Progress Notes (Unsigned)
 Cardiology Office Note:  .   Date:  03/11/2023  ID:  Donald Mow., DOB January 13, 1950, MRN [id number] PCP: Donald Joe, MD  [facility] Providers Cardiologist:  Donald Batty, MD Electrophysiologist:  Donald Bunting, MD { EP: Dr. Ladona Norman  History of Present Illness: .   Donald Norman. is a 73 y.o. 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   -- coronary calcium scoring which was elevated at 1374, which was 90th percentile for age and sex matched controls. He underwent a Myoview which showed a severe defect at rest worse in the basal inferior inferoseptal wall. This was not felt to be consistent with ischemia or infarct. Cardiac cath was considered but this was deferred   He saw Dr. Ladona Norman 10/06/21, discussed his nsSVT/NSVT, w/u with Ca score of 1300+ Placed on flecainide with good resolution of palpitations.  C/o fatigue, wondered if it was his BB   He held his BB for a short time/few days without improvement and resumed it  -- Repeat monitor in 10/2021 showed predominant sinus rhythm with rare PACs/PVCs, nonsustained SVT lasting less than 10 seconds, and no atrial fibrillation.   Seeing cardiology team a few times since then, of late with frequent episodes of near syncope, feeling tired, some palpitations. Pt mentioned similar symptoms last summer, and wwas trialed a couple days off flecainide without improvement apparently and continued. Symptoms did get better but started back up again recently.   most recently saw cardiology APP 09/11/22, episodes can last as long as 15  minutes , orthostatics were negative, planned for monitoring, labs, may need cath/new ischemic evaluation, planned to have him see EP post monitor 1st  SR/SB/ST Freq PVCs (4% burden)  Short runs of SVT/NSVT Needs ROV with me or an APP to discuss  I saw him 10/13/22 He reported: Generally never really feeling well, tired all of the time, no energy Often if not most days about 1100 for a few hours a clear slump in his energy with sense of feeling lightheaded as well, lasts a few hours and then starts to get back to his baseline Near syncope, these are more random, and a sense of a few seconds or so that he is about to faint and then quickly gone, not particularly position or upon stading, but thinks only happens when up on his feet  --He notes that when his BP gets towards the 110's he feels worese and gets there fairly regularly and even low 100s as well.  Never findds it high --He reports that all his life he has been pretty sensitive to medications and thinks symptoms may be part of that -- he has not felt any Afib  Symptom episodes on his monitor associate with PVCs, trigemeny mostly, though some symptom episodes with SR 90's, rare ectopy He is having day in and day out symptoms Ectopy burden was  1% supraventricular,  and 3.8% ventricular Avg HR 69, min was 48 at 04:46am In my personal review No QRS widening or AV block No CP No SOB No bleeding  His losartan stopped given symptoms seemed to line up timing of drug  I saw him 11/14/22, doing a bit better, still feeling a slump in energy late morning, urged to exercise/increase physical activity as able. Stable intervals Discussed coronary eval (discussed above), no changes were made  Saw cards APP 12/12/22, again doing a bit better, back on losartan with elevated BPs Also discussed AAD with Dr. Ladona Norman "There is not. He would have to come into the hospital for 3.5 days and start dofetilide, off label. If no evidence of ischemia on  exercise test, or QRS widening over 25%, continue flecainide.   Today's visit is scheduled for 34mo f/u  ROS:   For the most part about the same Just never really feels like he feels well Has taken notice that he feels like he has head congestion, sinuses, wonders if that may be the problem He again, experimented off flecainide about 4 days with  no improvement in how he felt and resume it again without improvement or worsening in symptoms Over the weekend felt dizzy with a sense of feeling off balance  No CP, SOB, DOE No syncope  Arrhythmia/AAD hx AFib, NS-SVT, NSVTs Flecainide started June 2023  Studies Reviewed: Marland Kitchen    EKG done today and reviewed by myself SR 68bpm, LAD, PR , QRS 98ms, QTc No ectopy  11/14/22: SR 67bpm, , LAD, PR , QRS , QTc 10/13/22: SR 63bpm, PR , QRS 96ms, QTc  Risk Assessment/Calculations:    Physical Exam:   VS:  There were no vitals taken for this visit.   Wt Readings from Last 3 Encounters:  12/12/22 235 lb (106.6 kg)  11/14/22 235 lb (106.6 kg)  10/13/22 235 lb (106.6 kg)    GEN: Well nourished, well developed in no acute distress NECK: No JVD; No carotid bruits CARDIAC: RRR, no ectopy appreciated today with prolonged auscultation, no murmurs, rubs, gallops RESPIRATORY: CTA b/l without rales, wheezing or rhonchi  ABDOMEN: Soft, non-tender, non-distended EXTREMITIES: No edema; No deformity     ASSESSMENT AND PLAN: .    NS-SVTs NSVT PVCs Paroxysmal AFib CHA2DS2Vasc is 3, on Eliquis, appropriately dosed Minimal/fleeting palpitations On flecainide/Toprol, stable intervals  Coronary Ca++ stress test not felt to represent any ischemia in review of Dr. Hazle Coca notes, "diaphragmatic attenuation without ischemia"  Will make no changes at this time to his AAD   Was going to do labs today for his Eliquis, though he sees Dr. Allyson Sabal next week > suspect he will want labs and defer to him  5.  Fatigue 6.   Weakness 7.  Lightheaded, near syncope Encouraged increased physical activity to his ability F/u with him PMD  8.  Secondary hypercoagulable state   9. HTN No changes today   Dispo: back with Dr. Ladona Norman in 3-20mo, sooner if needed   Signed, Sheilah Pigeon, PA-C

## 2023-03-14 ENCOUNTER — Ambulatory Visit: Payer: Medicare Other | Attending: Physician Assistant | Admitting: Physician Assistant

## 2023-03-14 VITALS — BP 130/72 | HR 68 | Ht 72.0 in | Wt 241.0 lb

## 2023-03-14 DIAGNOSIS — I480 Paroxysmal atrial fibrillation: Secondary | ICD-10-CM | POA: Diagnosis not present

## 2023-03-14 DIAGNOSIS — I493 Ventricular premature depolarization: Secondary | ICD-10-CM

## 2023-03-14 DIAGNOSIS — I471 Supraventricular tachycardia, unspecified: Secondary | ICD-10-CM | POA: Diagnosis not present

## 2023-03-14 DIAGNOSIS — I4729 Other ventricular tachycardia: Secondary | ICD-10-CM

## 2023-03-14 DIAGNOSIS — R5383 Other fatigue: Secondary | ICD-10-CM | POA: Diagnosis not present

## 2023-03-14 DIAGNOSIS — Z5181 Encounter for therapeutic drug level monitoring: Secondary | ICD-10-CM | POA: Diagnosis not present

## 2023-03-14 DIAGNOSIS — Z79899 Other long term (current) drug therapy: Secondary | ICD-10-CM

## 2023-03-14 MED ORDER — FLECAINIDE ACETATE 50 MG PO TABS: 75.0000 mg | ORAL_TABLET | Freq: Two times a day (BID) | ORAL | 6 refills | Status: DC

## 2023-03-14 NOTE — Patient Instructions (Signed)
 Medication Instructions:   Your physician recommends that you continue on your current medications as directed. Please refer to the Current Medication list given to you today.  *If you need a refill on your cardiac medications before your next appointment, please call your pharmacy*   Lab Work: NONE ORDERED  TODAY   If you have labs (blood work) drawn today and your tests are completely normal, you will receive your results only by: MyChart Message (if you have MyChart) OR A paper copy in the mail If you have any lab test that is abnormal or we need to change your treatment, we will call you to review the results.   Testing/Procedures: NONE ORDERED  TODAY    Follow-Up: At [facility], you and your health needs are our priority.  As part of our continuing mission to provide you with exceptional heart care, we have created designated Provider Care Teams.  These Care Teams include your primary Cardiologist (physician) and Advanced Practice Providers (APPs -  Physician Assistants and Nurse Practitioners) who all work together to provide you with the care you need, when you need it.  We recommend signing up for the patient portal called "MyChart".  Sign up information is provided on this After Visit Summary.  MyChart is used to connect with patients for Virtual Visits (Telemedicine).  Patients are able to view lab/test results, encounter notes, upcoming appointments, etc.  Non-urgent messages can be sent to your provider as well.   To learn more about what you can do with MyChart, go to [email or web address].    Your next appointment:    3 month(s)  ( CONTACT  CASSIE HALL/ ANGELINE HAMMER FOR EP SCHEDULING ISSUES )     Provider:    [name], MD

## 2023-03-15 DIAGNOSIS — J329 Chronic sinusitis, unspecified: Secondary | ICD-10-CM | POA: Diagnosis not present

## 2023-03-19 ENCOUNTER — Encounter: Payer: Self-pay | Admitting: Cardiovascular Disease

## 2023-03-19 ENCOUNTER — Ambulatory Visit: Payer: Medicare Other | Attending: Cardiovascular Disease | Admitting: Cardiovascular Disease

## 2023-03-19 VITALS — BP 144/77 | HR 71 | Ht 72.0 in | Wt 242.0 lb

## 2023-03-19 DIAGNOSIS — I10 Essential (primary) hypertension: Secondary | ICD-10-CM

## 2023-03-19 DIAGNOSIS — R931 Abnormal findings on diagnostic imaging of heart and coronary circulation: Secondary | ICD-10-CM | POA: Diagnosis not present

## 2023-03-19 DIAGNOSIS — E782 Mixed hyperlipidemia: Secondary | ICD-10-CM

## 2023-03-19 DIAGNOSIS — I493 Ventricular premature depolarization: Secondary | ICD-10-CM

## 2023-03-19 DIAGNOSIS — I480 Paroxysmal atrial fibrillation: Secondary | ICD-10-CM

## 2023-03-19 NOTE — Assessment & Plan Note (Signed)
 History of hyperlipidemia on rosuvastatin with lipid profile performed/30/24 revealing total cholesterol 112, LDL 58 and HDL 38.

## 2023-03-19 NOTE — Progress Notes (Signed)
 03/19/2023 Donald Norman.   30-Sep-1950  [id number]  Primary Physician Donald Joe, MD Primary Cardiologist: Donald Gess MD Donald Norman, [place], [place]  HPI:  Donald Norman. is a 73 y.o.   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  He was referred by Dr. Tally Norman  for evaluation of palpitations.  Apparently I take care of his Sister Donald Norman as well as his mother Donald Norman who died at age 68.  I last saw him in the office 03/14/2022.  His cardiac risk factor profile is remarkable for treated hypertension, untreated hyperlipidemia.  His father did die of a myocardial infarction.  He is never had a heart attack or stroke.  He denies chest pain or shortness of breath.  He noticed the onset of tachypalpitations July 2022.  He had 15 episodes which she is captured on his "apple watch" which last for seconds to minutes at a time and are self-limited without symptoms.  He has decreased his caffeine intake and has not had any symptoms in the last 3 months.   He had a coronary calcium score performed 05/05/21 which was 1374.  Based on this he underwent a Myoview stress test that showed diaphragmatic attenuation without ischemia and a 2D echo that was essentially normal.  An event monitor because of palpitations showed PVCs, PAT, PSVT and PAF.  He was subsequent begun on Eliquis oral anticoagulation for stroke prophylaxis.  In addition, because of his elevated coronary calcium score he was begun on rosuvastatin which resulted in marked improvement in his cholesterol most recently measured 08/24/2021.  Total cholesterol is 129 with an LDL of 68 and HDL of 48.   Since I saw him in the office a year ago he continually improved.  He was recently evaluated by his PCP for dizziness and was found to have sinus infection and was placed on Augmentin which has afforded him significant  clinical improvement.  He is seeing Dr. Ladona Norman for his high burden PVCs and was placed on flecainide.  He denies chest pain or shortness of breath.  He did have a routine GXT performed 01/04/2023 which was normal.   Current Meds  Medication Sig   amoxicillin-clavulanate (AUGMENTIN) 875-125 MG tablet Take 1 tablet by mouth 2 (two) times daily.   apixaban (ELIQUIS) 5 MG TABS tablet Take 1 tablet (5 mg total) by mouth 2 (two) times daily.   Coenzyme Q10 (CO Q-10) 100 MG CAPS Take 1 tablet by mouth every morning.   flecainide (TAMBOCOR) 50 MG tablet Take 1.5 tablets (75 mg total) by mouth 2 (two) times daily.   fluticasone (FLONASE) 50 MCG/ACT nasal spray Place 1 spray into both nostrils daily.   Ibuprofen 200 MG CAPS Take 400 mg by mouth every other day.   levocetirizine (XYZAL) 5 MG tablet daily after breakfast.   losartan (COZAAR) 25 MG tablet Take 25 mg by mouth daily.   metoprolol succinate (TOPROL-XL) 25 MG 24 hr tablet Take 0.5 tablets (12.5 mg total) by mouth daily. Take with or immediately following a meal.   Multiple Vitamin (MULTI-VITAMIN DAILY) TABS Take 1 tablet by mouth every morning.   Polyethyl Glycol-Propyl Glycol (SYSTANE) 0.4-0.3 % SOLN Place 1 drop into both eyes as needed.   rosuvastatin (CRESTOR) 20 MG tablet Take 1 tablet (20 mg total) by mouth daily.   sertraline (ZOLOFT) 25 MG tablet Take  1 tablet (25 mg total) by mouth daily.   testosterone (ANDROGEL) 50 MG/5GM (1%) GEL Place onto the skin daily.     No Known Allergies  Social History   Socioeconomic History   Marital status: Married    Spouse name: Not on file   Number of children: Not on file   Years of education: college   Highest education level: Not on file  Occupational History   Occupation: Magazine features editor  Tobacco Use   Smoking status: Never   Smokeless tobacco: Never  Substance and Sexual Activity   Alcohol use: No    Alcohol/week: 2.0 standard drinks of alcohol    Types: 2 Standard drinks or equivalent  per week   Drug use: No   Sexual activity: Yes    Partners: Female  Other Topics Concern   Not on file  Social History Narrative   Not on file   Social Drivers of Health   Financial Resource Strain: Not on file  Food Insecurity: Not on file  Transportation Needs: Not on file  Physical Activity: Not on file  Stress: Not on file  Social Connections: Unknown (05/17/2021)   [facility] from [facility]   Social Network    Social Network: Not on file  Intimate Partner Violence: Unknown (04/08/2021)   Received from [facility]   HITS    Physically Hurt: Not on file    Insult or Talk Down To: Not on file    Threaten Physical Harm: Not on file    Scream or Curse: Not on file     Review of Systems: General: negative for chills, fever, night sweats or weight changes.  Cardiovascular: negative for chest pain, dyspnea on exertion, edema, orthopnea, palpitations, paroxysmal nocturnal dyspnea or shortness of breath Dermatological: negative for rash Respiratory: negative for cough or wheezing Urologic: negative for hematuria Abdominal: negative for nausea, vomiting, diarrhea, bright red blood per rectum, melena, or hematemesis Neurologic: negative for visual changes, syncope, or dizziness All other systems reviewed and are otherwise negative except as noted above.    Blood pressure (!) 144/77, pulse 71, height 6' (1.829 m), weight 242 lb (109.8 kg), SpO2 95%.  General appearance: alert and no distress Neck: no adenopathy, no carotid bruit, no JVD, supple, symmetrical, trachea midline, and thyroid not enlarged, symmetric, no tenderness/mass/nodules Lungs: clear to auscultation bilaterally Heart: regular rate and rhythm, S1, S2 normal, no murmur, click, rub or gallop Extremities: extremities normal, atraumatic, no cyanosis or edema Pulses: 2+ and symmetric Skin: Skin color, texture, turgor normal. No rashes or lesions Neurologic: Grossly normal  EKG not  performed today      ASSESSMENT AND PLAN:   Hypertension History of essential hypertension with blood pressure measured today at 144/77.  Has been lower in the past.  He is on losartan and metoprolol.  PVC (premature ventricular contraction) History of palpitations with relatively high burden PVCs (4% burden) by monitor performed 09/21/2022 currently on flecainide and metoprolol.  He feels clinically improved.  He is followed by Dr. Ladona Norman.  PAF (paroxysmal atrial fibrillation) (HCC) History of PAF on Eliquis oral anticoagulation.  Elevated coronary artery calcium score Elevated coronary calcium score of 1374 performed 05/05/21 with a subsequent negative Myoview stress test normal 2D echo.  He is asymptomatic and at goal for secondary prevention.  Hyperlipidemia History of hyperlipidemia on rosuvastatin with lipid profile performed/30/24 revealing total cholesterol 112, LDL 58 and HDL 38.     Donald Gess MD [facility], [facility] 03/19/2023  9:45 AM

## 2023-03-19 NOTE — Assessment & Plan Note (Signed)
 History of essential hypertension with blood pressure measured today at 144/77.  Has been lower in the past.  He is on losartan and metoprolol.

## 2023-03-19 NOTE — Patient Instructions (Signed)
 Medication Instructions:  Your physician recommends that you continue on your current medications as directed. Please refer to the Current Medication list given to you today.  *If you need a refill on your cardiac medications before your next appointment, please call your pharmacy*   Follow-Up: At [facility], you and your health needs are our priority.  As part of our continuing mission to provide you with exceptional heart care, we have created designated Provider Care Teams.  These Care Teams include your primary Cardiologist (physician) and Advanced Practice Providers (APPs -  Physician Assistants and Nurse Practitioners) who all work together to provide you with the care you need, when you need it.  We recommend signing up for the patient portal called "MyChart".  Sign up information is provided on this After Visit Summary.  MyChart is used to connect with patients for Virtual Visits (Telemedicine).  Patients are able to view lab/test results, encounter notes, upcoming appointments, etc.  Non-urgent messages can be sent to your provider as well.   To learn more about what you can do with MyChart, go to [email or web address].    Your next appointment:   6 month(s)  Provider:   Bernadene Person, NP or Reather Littler, NP       Then, [name], MD will plan to see you again in 12 month(s).    Other Instructions   1st Floor: - Lobby - Registration  - Pharmacy  - Lab - Cafe  2nd Floor: - PV Lab - Diagnostic Testing (echo, CT, nuclear med)  3rd Floor: - Vacant  4th Floor: - TCTS (cardiothoracic surgery) - AFib Clinic - Structural Heart Clinic - Vascular Surgery  - Vascular Ultrasound  5th Floor: - HeartCare Cardiology (general and EP) - Clinical Pharmacy for coumadin, hypertension, lipid, weight-loss medications, and med management appointments    Valet parking services will be available as well.

## 2023-03-19 NOTE — Assessment & Plan Note (Signed)
 Elevated coronary calcium score of 1374 performed 05/05/21 with a subsequent negative Myoview stress test normal 2D echo.  He is asymptomatic and at goal for secondary prevention.

## 2023-03-19 NOTE — Assessment & Plan Note (Signed)
History of PAF on Eliquis oral anticoagulation. 

## 2023-03-19 NOTE — Assessment & Plan Note (Signed)
 History of palpitations with relatively high burden PVCs (4% burden) by monitor performed 09/21/2022 currently on flecainide and metoprolol.  He feels clinically improved.  He is followed by [name].

## 2023-04-02 ENCOUNTER — Other Ambulatory Visit: Payer: Self-pay | Admitting: Cardiovascular Disease

## 2023-04-02 DIAGNOSIS — I480 Paroxysmal atrial fibrillation: Secondary | ICD-10-CM

## 2023-04-02 NOTE — Telephone Encounter (Signed)
 Eliquis 5mg  refill request received. Patient is 73 years old, weight-109.8kg, Crea- 0.91 on 09/11/22, Diagnosis-Afib, and last seen by Dr. Allyson Sabal on 03/19/23. Dose is appropriate based on dosing criteria. Will send in refill to requested pharmacy.

## 2023-05-13 ENCOUNTER — Other Ambulatory Visit: Payer: Self-pay | Admitting: Cardiovascular Disease

## 2023-06-20 ENCOUNTER — Encounter: Payer: Self-pay | Admitting: Internal Medicine

## 2023-06-20 ENCOUNTER — Ambulatory Visit: Attending: Cardiology | Admitting: Internal Medicine

## 2023-06-20 VITALS — BP 128/64 | HR 68 | Ht 72.0 in | Wt 238.5 lb

## 2023-06-20 DIAGNOSIS — I10 Essential (primary) hypertension: Secondary | ICD-10-CM

## 2023-06-20 NOTE — Patient Instructions (Signed)
 Medication Instructions:  Your physician recommends that you continue on your current medications as directed. Please refer to the Current Medication list given to you today.  *If you need a refill on your cardiac medications before your next appointment, please call your pharmacy*  Lab Work: None ordered.  You may go to any [facility] Location for your lab work:  [facility] - [id number] ([facility]) - [address] [phone] [address]  [place] - [address]   [place]  - [address]   [place] - [address] - [address] [employer]  - [address] ([facility]   If you have labs (blood work) drawn today and your tests are completely normal, you will receive your results only by: [employer] (if you have MyChart)  If you have any lab test that is abnormal or we need to change your treatment, we will call you or send a MyChart message to review the results.  Testing/Procedures: None ordered.  Follow-Up: At [facility], you and your health needs are our priority.  As part of our continuing mission to provide you with exceptional heart care, we have created designated Provider Care Teams.  These Care Teams include your primary Cardiologist (physician) and Advanced Practice Providers (APPs -  Physician Assistants and Nurse Practitioners) who all work together to provide you with the care you need, when you need it.  Your next appointment:   1 year(s)  The format for your next appointment:   In Person  Provider:   Manya Sells, MD{or one of the following Advanced Practice Providers on your designated Care Team:   Mertha Abrahams, [place] [address]" [place], [place] Neda Balk, NP

## 2023-06-20 NOTE — Progress Notes (Signed)
 HPI [name] returns for ongoing evaluation of palpitations and NS SVT and NS VT. He is a very pleasant 73 yo man with palpitations who was found on cardiac monitoring to have NS SVT and NS VT. He has not had syncope. He denies chest pain or any anginal symptoms. He has minimal dyspnea with exertion. No edema. He has a calcium  score of over 1300. I placed him on low dose flecainide  and his palpitations have resolved. He had complained of being fatigued and we stopped his beta blocker but the fatigue did not change so it was restarted.  No Known Allergies   Current Outpatient Medications  Medication Sig Dispense Refill   apixaban  (ELIQUIS ) 5 MG TABS tablet TAKE ONE TABLET BY MOUTH TWICE DAILY 180 tablet 1   Coenzyme Q10 (CO Q-10) 100 MG CAPS Take 1 tablet by mouth every morning.     flecainide  (TAMBOCOR ) 50 MG tablet Take 1.5 tablets (75 mg total) by mouth 2 (two) times daily. 90 tablet 6   fluticasone  (FLONASE ) 50 MCG/ACT nasal spray Place 1 spray into both nostrils daily.     Ibuprofen 200 MG CAPS Take 400 mg by mouth every other day.     losartan  (COZAAR ) 25 MG tablet Take 25 mg by mouth daily.     metoprolol  succinate (TOPROL -XL) 25 MG 24 hr tablet Take 0.5 tablets (12.5 mg total) by mouth daily. Take with or immediately following a meal. 90 tablet 3   Multiple Vitamin (MULTI-VITAMIN DAILY) TABS Take 1 tablet by mouth every morning.     Polyethyl Glycol-Propyl Glycol (SYSTANE) 0.4-0.3 % SOLN Place 1 drop into both eyes as needed.     rosuvastatin  (CRESTOR ) 20 MG tablet Take 1 tablet (20 mg total) by mouth daily. 90 tablet 3   sertraline  (ZOLOFT ) 25 MG tablet Take 1 tablet (25 mg total) by mouth daily. 90 tablet 1   testosterone  (ANDROGEL ) 50 MG/5GM (1%) GEL Place onto the skin daily.     amoxicillin -clavulanate (AUGMENTIN ) 875-125 MG tablet Take 1 tablet by mouth 2 (two) times daily. (Patient not taking: Reported on 06/20/2023)     levocetirizine (XYZAL) 5 MG tablet daily after  breakfast. (Patient not taking: Reported on 06/20/2023)     No current facility-administered medications for this visit.     Past Medical History:  Diagnosis Date   Allergy    Anemia    Basal cell cancer    Cancer (HCC)    Depression    Heart murmur    Hypertension    PVC (premature ventricular contraction)    Tendonitis     ROS:   All systems reviewed and negative except as noted in the HPI.   Past Surgical History:  Procedure Laterality Date   CYSTOSCOPY     EYE SURGERY  lasik and retina repair   HERNIA REPAIR     TONSILLECTOMY AND ADENOIDECTOMY       Family History  Problem Relation Age of Onset   Heart disease Mother    Hypertension Mother    Diabetes Father    Heart disease Father    Hypertension Sister    Anemia Sister    Heart disease Maternal Grandmother    Diabetes Paternal Grandmother      Social History   Socioeconomic History   Marital status: Married    Spouse name: Not on file   Number of children: Not on file   Years of education: college   Highest education level: Not  on file  Occupational History   Occupation: Magazine features editor  Tobacco Use   Smoking status: Never   Smokeless tobacco: Never  Substance and Sexual Activity   Alcohol use: No    Alcohol/week: 2.0 standard drinks of alcohol    Types: 2 Standard drinks or equivalent per week   Drug use: No   Sexual activity: Yes    Partners: Female  Other Topics Concern   Not on file  Social History Narrative   Not on file   Social Drivers of Health   Financial Resource Strain: Not on file  Food Insecurity: Not on file  Transportation Needs: Not on file  Physical Activity: Not on file  Stress: Not on file  Social Connections: Unknown (05/17/2021)   Received from [facility]   Social Network    Social Network: Not on file  Intimate Partner Violence: Unknown (04/08/2021)   Received from [facility]   HITS    Physically Hurt: Not on file    Insult or Talk Down To: Not on file     Threaten Physical Harm: Not on file    Scream or Curse: Not on file     BP 128/64   Pulse 68   Ht 6' (1.829 m)   Wt 238 lb 8 oz (108.2 kg)   SpO2 94%   BMI 32.35 kg/m   Physical Exam:  Well appearing NAD HEENT: Unremarkable Neck:  No JVD, no thyromegally Lymphatics:  No adenopathy Back:  No CVA tenderness Lungs:  Clear HEART:  Regular rate rhythm, no murmurs, no rubs, no clicks Abd:  soft, positive bowel sounds, no organomegally, no rebound, no guarding Ext:  2 plus pulses, no edema, no cyanosis, no clubbing Skin:  No rashes no nodules Neuro:  CN II through XII intact, motor grossly intact  EKG - nsr   Assess/Plan:  NS AT - His symptoms are well controlled. No change in his meds including flecainide . HTN - he will continue his cozaar  but stop the metoprolol  PAF - He caries this diagnosis. The flecainide  will help.  Coags - he has not had any bleeding on systemic anti-coagulation. Fatigue - this is still present but a little better. I encouraged him to exercise.     Donald Brand Makeba Delcastillo,MD

## 2023-06-28 DIAGNOSIS — R7309 Other abnormal glucose: Secondary | ICD-10-CM | POA: Diagnosis not present

## 2023-06-28 DIAGNOSIS — Z0000 Encounter for general adult medical examination without abnormal findings: Secondary | ICD-10-CM | POA: Diagnosis not present

## 2023-06-28 DIAGNOSIS — E782 Mixed hyperlipidemia: Secondary | ICD-10-CM | POA: Diagnosis not present

## 2023-06-28 DIAGNOSIS — I480 Paroxysmal atrial fibrillation: Secondary | ICD-10-CM | POA: Diagnosis not present

## 2023-06-28 DIAGNOSIS — J309 Allergic rhinitis, unspecified: Secondary | ICD-10-CM | POA: Diagnosis not present

## 2023-06-28 DIAGNOSIS — E349 Endocrine disorder, unspecified: Secondary | ICD-10-CM | POA: Diagnosis not present

## 2023-06-28 DIAGNOSIS — Z23 Encounter for immunization: Secondary | ICD-10-CM | POA: Diagnosis not present

## 2023-06-28 DIAGNOSIS — I10 Essential (primary) hypertension: Secondary | ICD-10-CM | POA: Diagnosis not present

## 2023-07-03 ENCOUNTER — Encounter: Payer: Self-pay | Admitting: Internal Medicine

## 2023-07-25 DIAGNOSIS — R350 Frequency of micturition: Secondary | ICD-10-CM | POA: Diagnosis not present

## 2023-07-25 DIAGNOSIS — K7689 Other specified diseases of liver: Secondary | ICD-10-CM | POA: Diagnosis not present

## 2023-07-25 DIAGNOSIS — R161 Splenomegaly, not elsewhere classified: Secondary | ICD-10-CM | POA: Diagnosis not present

## 2023-07-25 DIAGNOSIS — R300 Dysuria: Secondary | ICD-10-CM | POA: Diagnosis not present

## 2023-07-25 DIAGNOSIS — R1084 Generalized abdominal pain: Secondary | ICD-10-CM | POA: Diagnosis not present

## 2023-07-25 DIAGNOSIS — K5730 Diverticulosis of large intestine without perforation or abscess without bleeding: Secondary | ICD-10-CM | POA: Diagnosis not present

## 2023-07-25 DIAGNOSIS — N281 Cyst of kidney, acquired: Secondary | ICD-10-CM | POA: Diagnosis not present

## 2023-08-07 DIAGNOSIS — R300 Dysuria: Secondary | ICD-10-CM | POA: Diagnosis not present

## 2023-08-07 DIAGNOSIS — R109 Unspecified abdominal pain: Secondary | ICD-10-CM | POA: Diagnosis not present

## 2023-08-13 DIAGNOSIS — M545 Low back pain, unspecified: Secondary | ICD-10-CM | POA: Diagnosis not present

## 2023-08-13 DIAGNOSIS — I480 Paroxysmal atrial fibrillation: Secondary | ICD-10-CM | POA: Diagnosis not present

## 2023-10-08 ENCOUNTER — Other Ambulatory Visit: Payer: Self-pay | Admitting: Cardiovascular Disease

## 2023-10-08 DIAGNOSIS — I480 Paroxysmal atrial fibrillation: Secondary | ICD-10-CM

## 2023-10-08 NOTE — Telephone Encounter (Signed)
 Prescription refill request for Eliquis  received. Indication:afib Last office visit:6/25 Scr:0.88  6/25 Age: 73 Weight:108.2  kg  Prescription refilled

## 2023-11-03 IMAGING — CT CT CARDIAC CORONARY ARTERY CALCIUM SCORE
3 series · 14 of 20 positions shown, 16 images · non-contrast
Comparison: None Available.
COMPARISON: None Available.

Addendum:
CLINICAL DATA: This over-read does not include interpretation of
cardiac or coronary anatomy or pathology. The coronary calcium score
interpretation by the cardiologist is attached.
TECHNIQUE: A gated, non-contrast computed tomography scan of the heart was
performed using 3mm slice thickness. Axial images were analyzed on a
dedicated workstation. Calcium scoring of the coronary arteries was
performed using the Agatston method.

[Series 2: cascseq 2.0 sa36 70% (id) · axial · 0.40mm/px · z∈[-298,-196]mm · 4 of 85 slices shown]
[im 17/85  vessel]
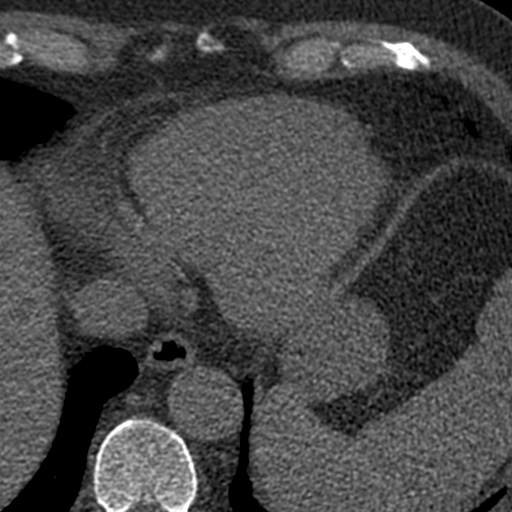
[im 34/85  vessel]
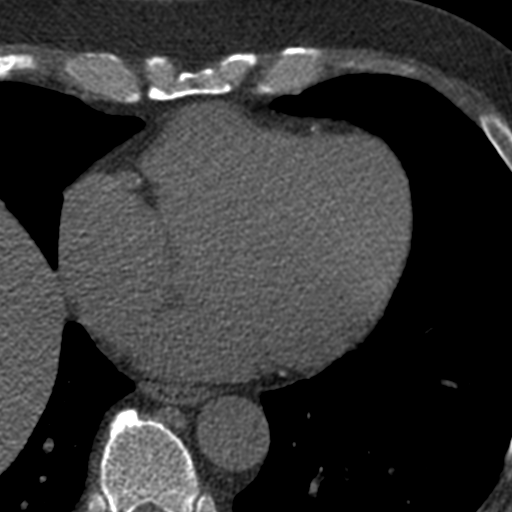
[im 51/85  vessel]
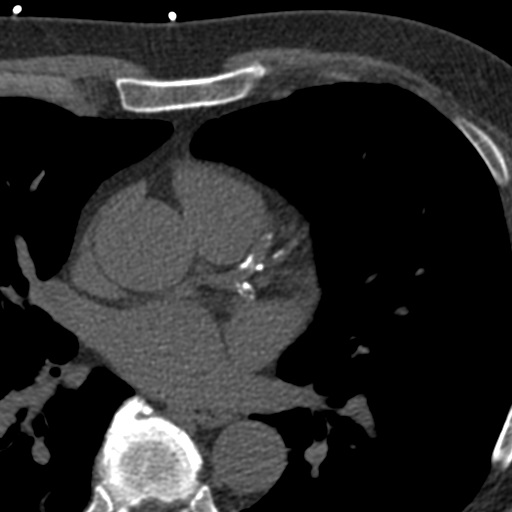
[im 68/85  vessel]
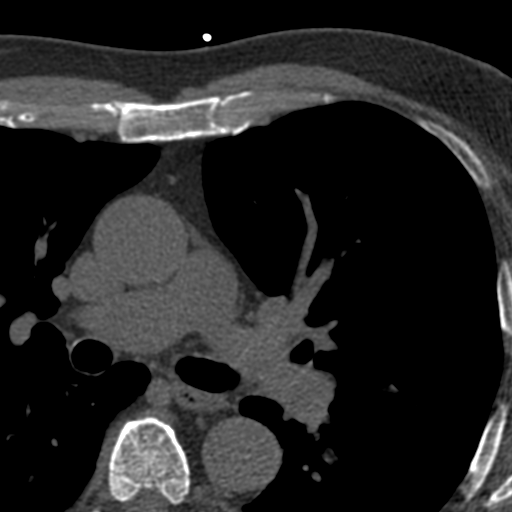

[Series 3: cascseq 2.0 bf37 st · axial · 0.79mm/px · z∈[-302,-190]mm · 5 of 85 slices shown, 7 images]
[im 15/85  vessel]
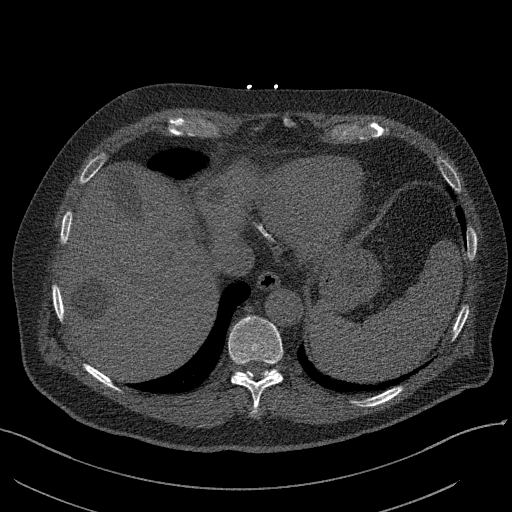
[im 15/85  lung]
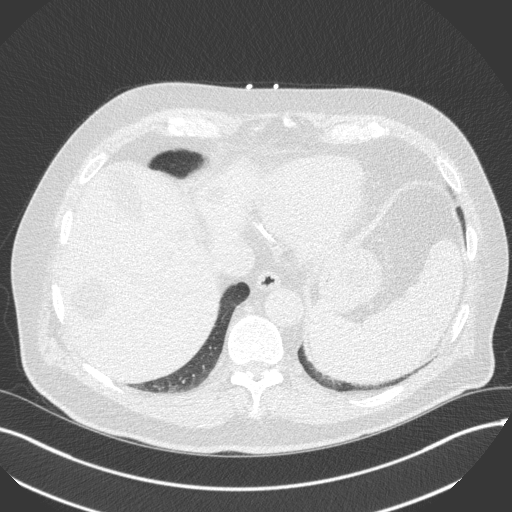
[im 29/85  vessel]
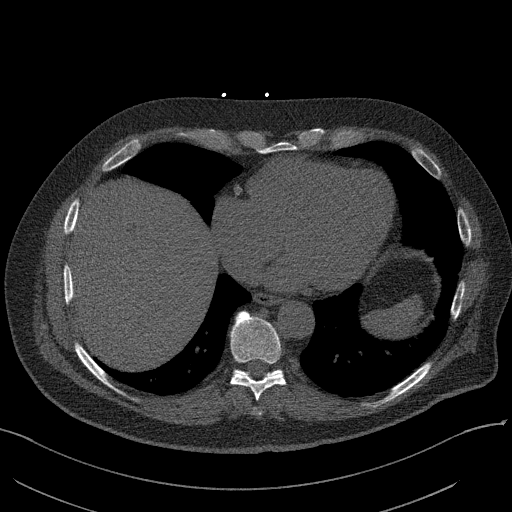
[im 43/85  vessel]
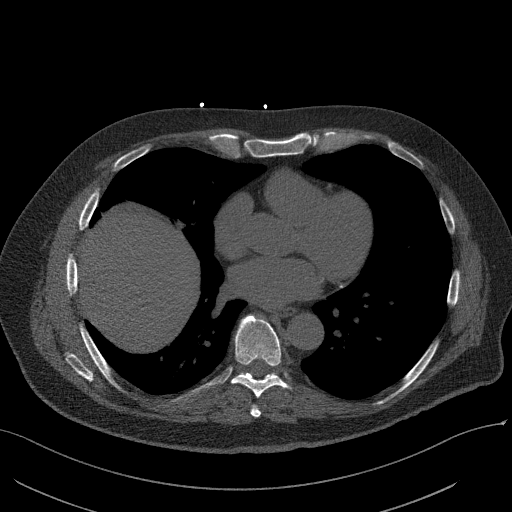
[im 57/85  vessel]
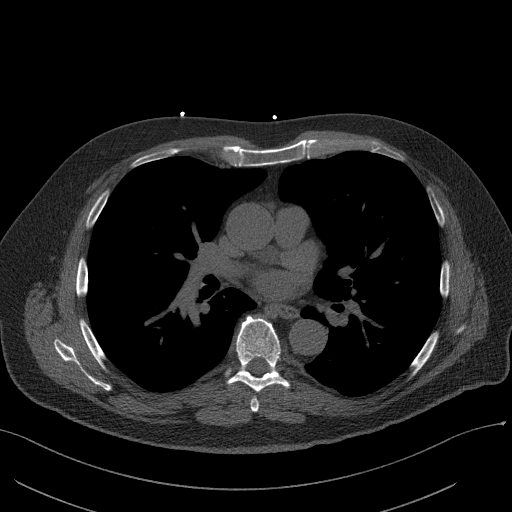
[im 71/85  vessel]
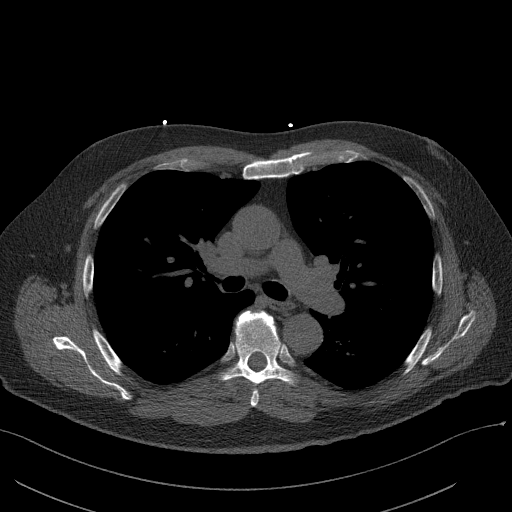
[im 71/85  lung]
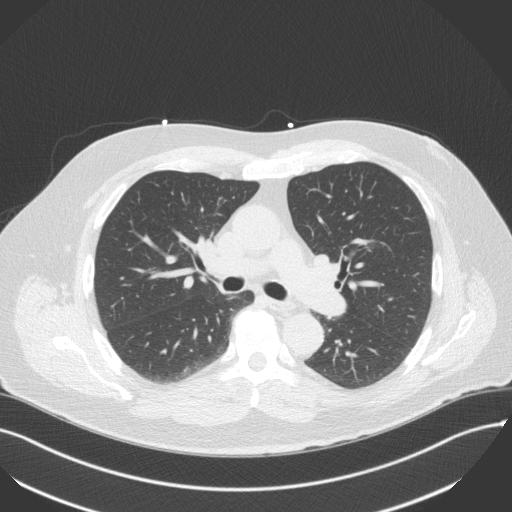

[Series 4: cascseq 2.0 br59 lung · axial · 0.79mm/px · z∈[-302,-190]mm · 5 of 85 slices shown]
[im 15/85  lung]
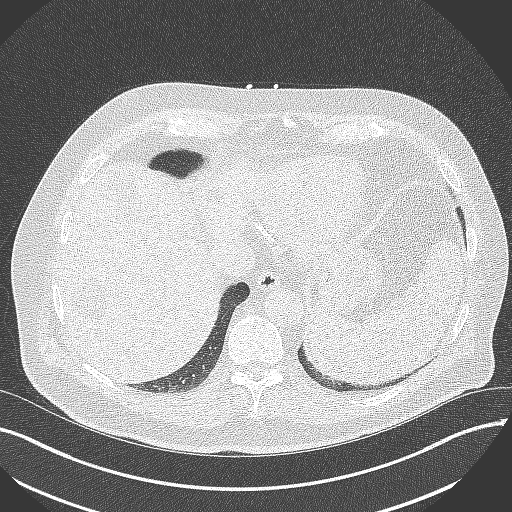
[im 29/85  lung]
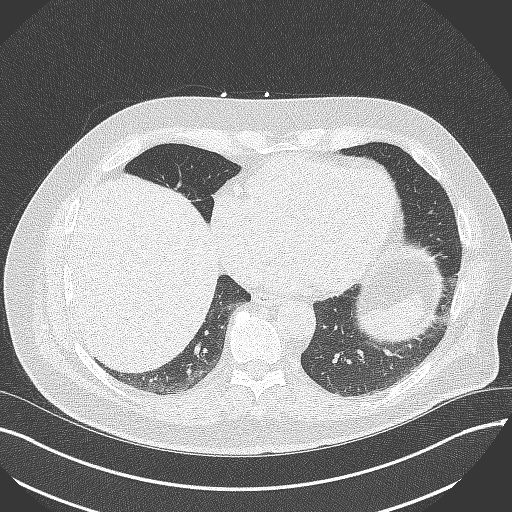
[im 43/85  lung]
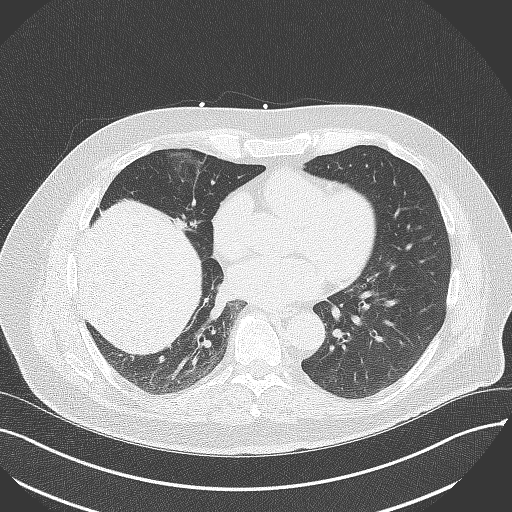
[im 57/85  lung]
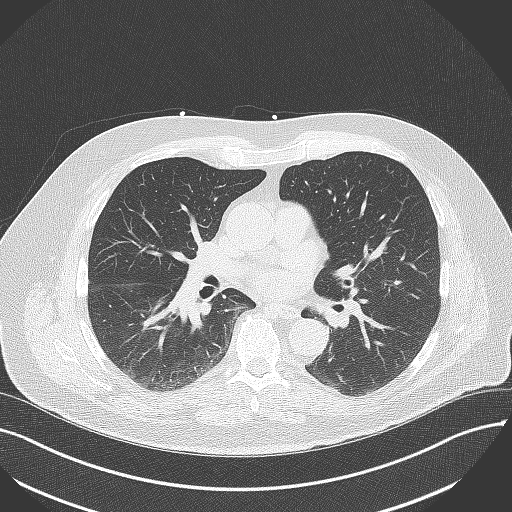
[im 71/85  lung]
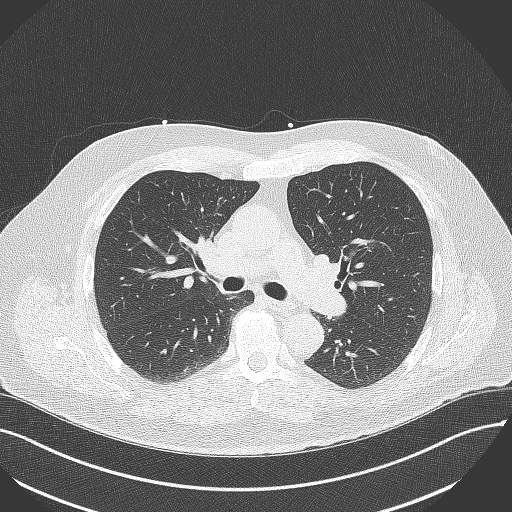

[14 of 20 positions shown; findings below may reference images not displayed]

FINDINGS: Vascular: Heart is normal size. Aorta normal caliber. Scattered
calcifications in the aortic root and arch.

Mediastinum/Nodes: No adenopathy

Lungs/Pleura: Areas of scarring in the lung bases. Elevation of the
right hemidiaphragm. No acute confluent opacities or effusions.

Upper Abdomen: Small gallstone layering within the gallbladder.
Scattered low-density lesions in the liver measuring up to 3 cm most
compatible with cysts. No acute findings.

Musculoskeletal: Chest wall soft tissues are unremarkable. No acute
bony abnormality.
IMPRESSION: Scattered aortic atherosclerosis.

Cholelithiasis.

Scattered hepatic cysts.

No acute extra cardiac abnormality.

ADDENDUM:
Cardiovascular Disease Risk stratification

EXAM:
Coronary Calcium Score
FINDINGS: Coronary arteries: Normal origins.

Coronary Calcium Score:

Left main: 0

Left anterior descending artery: 374

Left circumflex artery: 389

Right coronary artery: 610

Total: 4797

Percentile: 90

Pericardium: Normal.

Ascending Aorta: Normal caliber.

Non-cardiac: See separate report from [REDACTED].
IMPRESSION: Coronary calcium score of 4797. This was 90 percentile for age-,
race-, and sex-matched controls.



If CAC=0, it is reasonable to withhold statin therapy and reassess
in 5 to 10 years, as long as higher risk conditions are absent
(diabetes mellitus, family history of premature CHD in first degree
relatives (males <55 years; females <65 years), cigarette smoking,
or LDL >=190 mg/dL).

If CAC is 1 to 99, it is reasonable to initiate statin therapy for
patients >=55 years of age.

If CAC is >=100 or >=75th percentile, it is reasonable to initiate
statin therapy at any age.

Cardiology referral should be considered for patients with CAC
scores >=400 or >=75th percentile.

*8831 AHA/ACC/AACVPR/AAPA/ABC/CALEB/BANNIKOV/JUNGERS/[place]/SIMMAX/SCHLESINGER/AKANS[place]
Guideline on the Management of Blood Cholesterol: A Report of the
American College of Cardiology/American Heart Association Task Force
on Clinical Practice Guidelines. J Am Coll Cardiol.
8636;73(24):9413-9480.

Yapara Maku, DO [REDACTED]

The noncardiac portion of this study will be interpreted in separate
report by the radiologist.

*** End of Addendum ***
FINDINGS: Vascular: Heart is normal size. Aorta normal caliber. Scattered
calcifications in the aortic root and arch.

Mediastinum/Nodes: No adenopathy

Lungs/Pleura: Areas of scarring in the lung bases. Elevation of the
right hemidiaphragm. No acute confluent opacities or effusions.

Upper Abdomen: Small gallstone layering within the gallbladder.
Scattered low-density lesions in the liver measuring up to 3 cm most
compatible with cysts. No acute findings.

Musculoskeletal: Chest wall soft tissues are unremarkable. No acute
bony abnormality.
IMPRESSION: Scattered aortic atherosclerosis.

Cholelithiasis.

Scattered hepatic cysts.

No acute extra cardiac abnormality.

## 2023-11-06 NOTE — Progress Notes (Unsigned)
 Cardiology Office Note    Date:  11/07/2023  ID:  Donald Nevel., DOB 1950/02/02, MRN [id number] PCP:  Seabron Lenis, MD  Cardiologist:  Dorn Lesches, MD  Electrophysiologist:  Danelle Birmingham, MD   Chief Complaint: Follow up for CAD   History of Present Illness: .    Donald Peatross. is a 73 y.o. male with visit-pertinent history of coronary artery calcification with elevated coronary calcium  score of 1374 in May 2023, paroxysmal A-fib, paroxysmal SVT and NSVT, HTN, HLD.  He is followed by Dr. Lesches. He presents today for routine follow up.    Donald Norman was referred back to Dr. Lesches in 02/2021, he had last been seen by cardiology in 2014.  He reported 15 episodes of palpitations during the summer of 07/2020 which were captured on his Apple Watch.  Reported that he decreased his caffeine intake with resolution of symptoms.  He wore a ZIO monitor that showed predominantly sinus rhythm with frequent PVCs, occasional PACs, 24 short runs of NSVT (longest 7 beats), 225 runs of SVT, (longest 17.8 seconds, and runs of PAF with less than 1% burden, he was started on Eliquis .  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  He underwent coronary calcium  scoring which was elevated at 1374, which was 90th percentile for age and sex matched controls.  He underwent a Myoview  which showed a severe defect at rest worse in the basal inferior inferoseptal wall.  This was not felt to be consistent with ischemia or infarct.  Cardiac cath was considered but this was deferred unless he develops symptoms concerning for angina.  For further evaluation of his palpitations arrhythmia he was referred to EP who started him on flecainide  in 10/2021.  Repeat monitor in 10/2021 showed predominant sinus rhythm with rare PACs/PVCs, nonsustained SVT lasting less than 10 seconds, and no atrial  fibrillation.   He was seen by Dr. Lesches in 03/2022, he had remained stable from a cardiac perspective.  He underwent repeat echocardiogram on 09/07/2022 that indicated the EF of 60 to 65%, LV with normal function, no RWMA, G1 DD, RV systolic function was normal with normal size, borderline dilation of the ascending aorta was noted at 37 mm.   At office visit on 09/11/2022 he reported increased episodes of presyncope and fatigue that were occuring nearly daily.  He reported episodes could last 15 to 30 minutes then felt fatigued the rest of the day.  He endorsed a slight fluttering in his chest with these episodes although he would check his Apple Watch which indicated sinus rhythm with these events.  He wore a 3-day cardiac monitor which showed an average heart rate of 69 bpm, ranging from 48 to 184 bpm.  Predominant underlying rhythm was sinus rhythm, he had 7 runs of ventricular tachycardia, 16 runs of supraventricular tachycardia run with the fastest interval lasting 4 beats with a max rate of 194 bpm, isolated VE's were occasional at 3.9%.  He was evaluated in EP on 10/13/2022 with discontinuation of losartan  as it was questioned if this was possibly causing his fatigue.  On his follow-up on 11/14/2022, he reported that he was feeling better although he did need to restart his losartan  as his blood pressure increased.   Exercise tolerance test on 01/04/2023 was negative with ETT.  Patient was last seen in clinic by Dr.  Taylor on 06/20/2023, remained stable from a cardiac standpoint.  No medication changes at that time.  Today he presents for follow-up.  He reports that he has been doing well overall.  He denies any chest pain, shortness of breath, lower extremity edema, orthopnea or PND.  He denies any recent palpitations or feeling of irregular heartbeats, he does note concerns regarding his flecainide  and would like to discuss with Dr. Waddell or an EP provider possibly discontinuing or changing medications  given concerns for side effects, denies any significant side effects at this time, however is concerned regarding ongoing use.  He also notes confusion on if he is to be taking his metoprolol , per notes from Dr. Waddell he was to discontinue however he does not remember being instructed to discontinue, currently is not taking per PCP citing Dr. Adrian notes.  He denies any dizziness, lightheadedness, presyncope or syncope. ROS: .   Today he denies chest pain, shortness of breath, lower extremity edema, fatigue, palpitations, melena, hematuria, hemoptysis, diaphoresis, weakness, presyncope, syncope, orthopnea, and PND.  All other systems are reviewed and otherwise negative. Studies Reviewed: SABRA   EKG:  EKG is ordered today, personally reviewed, demonstrating  EKG Interpretation Date/Time:  Wednesday November 07 2023 09:17:21 EST Ventricular Rate:  70 PR Interval:  174 QRS Duration:  98 QT Interval:  396 QTC Calculation: 427 R Axis:   -46  Text Interpretation: Normal sinus rhythm Left axis deviation Minimal voltage criteria for LVH, may be normal variant ( Cornell product ) When compared with ECG of 20-Jun-2023 09:45, No significant change was found Confirmed by Moneka Mcquinn [phone]) on 11/07/2023 9:22:25 AM   CV Studies: Cardiac studies reviewed are outlined and summarized above. Otherwise please see EMR for full report. Cardiac Studies & Procedures   ______________________________________________________________________________________________   STRESS TESTS  EXERCISE TOLERANCE TEST (ETT) 01/04/2023  Interpretation Summary   No ST deviation was noted. Negative ETT Patient reached 89% PMHR   HTN response to exercise   Frequent PvCls during stress   ECHOCARDIOGRAM  ECHOCARDIOGRAM COMPLETE 09/07/2022  Narrative ECHOCARDIOGRAM REPORT    Patient Name:   Donald Hemmelgarn. Date of Exam: 09/07/2022 Medical Rec #:  [id number]      Height:       71.0 in Accession #:    [id number]     Weight:        226.2 lb Date of Birth:  1950/01/20      BSA:          2.222 m Patient Age:    72 years       BP:           132/72 mmHg Patient Gender: M              HR:           65 bpm. Exam Location:  [address]  Procedure: 2D Echo, Cardiac Doppler and Color Doppler  Indications:    I10 Hypertension  History:        Patient has prior history of Echocardiogram examinations, most recent 05/05/2021. Arrythmias:PVC and Atrial Fibrillation, Signs/Symptoms:Murmur; Risk Factors:Sleep Apnea and Dyslipidemia. Elevated coronary artery calcium  score. Atrial tachycardia. Aneurysm of ascending aorta without rupture.  Sonographer:    Jon Hacker RCS Referring Phys: [phone] JONATHAN J BERRY  IMPRESSIONS   1. Left ventricular ejection fraction, by estimation, is 60 to 65%. The left ventricle has normal function. The left ventricle has no regional wall motion abnormalities. Left ventricular diastolic parameters are consistent with  Grade I diastolic dysfunction (impaired relaxation). 2. Right ventricular systolic function is normal. The right ventricular size is normal. There is normal pulmonary artery systolic pressure. The estimated right ventricular systolic pressure is 22.7 mmHg. 3. The mitral valve is normal in structure. Trivial mitral valve regurgitation. No evidence of mitral stenosis. 4. The aortic valve is tricuspid. Aortic valve regurgitation is not visualized. No aortic stenosis is present. 5. Aortic dilatation noted. There is borderline dilatation of the ascending aorta, measuring 37 mm. 6. The inferior vena cava is normal in size with greater than 50% respiratory variability, suggesting right atrial pressure of 3 mmHg.  FINDINGS Left Ventricle: Left ventricular ejection fraction, by estimation, is 60 to 65%. The left ventricle has normal function. The left ventricle has no regional wall motion abnormalities. The left ventricular internal cavity size was normal in size. There is no left ventricular  hypertrophy. Left ventricular diastolic parameters are consistent with Grade I diastolic dysfunction (impaired relaxation).  Right Ventricle: The right ventricular size is normal. No increase in right ventricular wall thickness. Right ventricular systolic function is normal. There is normal pulmonary artery systolic pressure. The tricuspid regurgitant velocity is 2.22 m/s, and with an assumed right atrial pressure of 3 mmHg, the estimated right ventricular systolic pressure is 22.7 mmHg.  Left Atrium: Left atrial size was normal in size.  Right Atrium: Right atrial size was normal in size.  Pericardium: There is no evidence of pericardial effusion.  Mitral Valve: The mitral valve is normal in structure. Trivial mitral valve regurgitation. No evidence of mitral valve stenosis.  Tricuspid Valve: The tricuspid valve is normal in structure. Tricuspid valve regurgitation is trivial.  Aortic Valve: The aortic valve is tricuspid. Aortic valve regurgitation is not visualized. No aortic stenosis is present.  Pulmonic Valve: The pulmonic valve was normal in structure. Pulmonic valve regurgitation is not visualized.  Aorta: The aortic root is normal in size and structure and aortic dilatation noted. There is borderline dilatation of the ascending aorta, measuring 37 mm.  Venous: The inferior vena cava is normal in size with greater than 50% respiratory variability, suggesting right atrial pressure of 3 mmHg.  IAS/Shunts: No atrial level shunt detected by color flow Doppler.   LEFT VENTRICLE PLAX 2D LVIDd:         4.50 cm   Diastology LVIDs:         2.30 cm   LV e' medial:    8.27 cm/s LV PW:         0.90 cm   LV E/e' medial:  9.2 LV IVS:        1.10 cm   LV e' lateral:   11.20 cm/s LVOT diam:     2.00 cm   LV E/e' lateral: 6.8 LV SV:         73 LV SV Index:   33 LVOT Area:     3.14 cm   RIGHT VENTRICLE RV Basal diam:  3.70 cm RV S prime:     13.40 cm/s TAPSE (M-mode): 2.9 cm RVSP:            22.7 mmHg  LEFT ATRIUM             Index        RIGHT ATRIUM           Index LA diam:        3.70 cm 1.67 cm/m   RA Pressure: 3.00 mmHg LA Vol (A2C):   39.3 ml 17.69 ml/m  RA Area:     15.20 cm LA Vol (A4C):   41.1 ml 18.50 ml/m  RA Volume:   32.80 ml  14.76 ml/m LA Biplane Vol: 42.4 ml 19.08 ml/m AORTIC VALVE LVOT Vmax:   106.00 cm/s LVOT Vmean:  69.400 cm/s LVOT VTI:    0.231 m  AORTA Ao Root diam: 3.30 cm Ao Asc diam:  3.70 cm  MITRAL VALVE               TRICUSPID VALVE MV Area (PHT): 3.46 cm    TR Peak grad:   19.7 mmHg MV Decel Time: 219 msec    TR Vmax:        222.00 cm/s MV E velocity: 76.00 cm/s  Estimated RAP:  3.00 mmHg MV A velocity: 93.00 cm/s  RVSP:           22.7 mmHg MV E/A ratio:  0.82 SHUNTS Systemic VTI:  0.23 m Systemic Diam: 2.00 cm  Donald Norman Electronically signed by Ezra Kanner Signature Date/Time: 09/07/2022/10:19:01 AM    Final    MONITORS  LONG TERM MONITOR (3-14 DAYS) 09/21/2022  Narrative Patch Wear Time:  3 days and 3 hours (2024-09-12T16:26:30-0400 to 2024-09-15T19:59:48-0400)  Patient had a min HR of 48 bpm, max HR of 184 bpm, and avg HR of 69 bpm. Predominant underlying rhythm was Sinus Rhythm. 7 Ventricular Tachycardia runs occurred, the run with the fastest interval lasting 7 beats with a max rate of 152 bpm (avg 105 bpm); the run with the fastest interval was also the longest. 16 Supraventricular Tachycardia runs occurred, the run with the fastest interval lasting 4 beats with a max rate of 184 bpm, the longest lasting 10 beats with an avg rate of 122 bpm. Isolated SVEs were rare (<1.0%), SVE Couplets were rare (<1.0%), and SVE Triplets were rare (<1.0%). Isolated VEs were occasional (3.9%, 12200), VE Couplets were rare (<1.0%, 306), and VE Triplets were rare (<1.0%, 28). Ventricular Bigeminy and Trigeminy were present  SR/SB/ST Freq PVCs (4% burden) Short runs of SVT/NSVT Needs ROV with me or an APP to discuss    CT SCANS  CT CARDIAC SCORING (SELF PAY ONLY) 05/05/2021  Addendum 05/05/2021  7:05 PM ADDENDUM REPORT: 05/05/2021 19:02  ADDENDUM: Cardiovascular Disease Risk stratification  EXAM: Coronary Calcium  Score  TECHNIQUE: A gated, non-contrast computed tomography scan of the heart was performed using 3mm slice thickness. Axial images were analyzed on a dedicated workstation. Calcium  scoring of the coronary arteries was performed using the Agatston method.  FINDINGS: Coronary arteries: Normal origins.  Coronary Calcium  Score:  Left main: 0  Left anterior descending artery: 374  Left circumflex artery: 389  Right coronary artery: 610  Total: 1374  Percentile: 90  Pericardium: Normal.  Ascending Aorta: Normal caliber.  Non-cardiac: See separate report from Shereta Crothers [facility].  IMPRESSION: Coronary calcium  score of 1374. This was 90 percentile for age-, race-, and sex-matched controls.  RECOMMENDATIONS: Coronary artery calcium  (CAC) score is a strong predictor of incident coronary heart disease (CHD) and provides predictive information beyond traditional risk factors. CAC scoring is reasonable to use in the decision to withhold, postpone, or initiate statin therapy in intermediate-risk or selected borderline-risk asymptomatic adults (age 34-75 years and LDL-C >=70 to <190 mg/dL) who do not have diabetes or established atherosclerotic cardiovascular disease (ASCVD).* In intermediate-risk (10-year ASCVD risk >=7.5% to <20%) adults or selected borderline-risk (10-year ASCVD risk >=5% to <7.5%) adults in whom a CAC score is measured for the purpose of making a treatment  decision the following recommendations have been made:  If CAC=0, it is reasonable to withhold statin therapy and reassess in 5 to 10 years, as long as higher risk conditions are absent (diabetes mellitus, family history of premature CHD in first degree relatives (males <55 years; females <65 years),  cigarette smoking, or LDL >=190 mg/dL).  If CAC is 1 to 99, it is reasonable to initiate statin therapy for patients >=73 years of age.  If CAC is >=100 or >=75th percentile, it is reasonable to initiate statin therapy at any age.  Cardiology referral should be considered for patients with CAC scores >=400 or >=75th percentile.  *2018 AHA/ACC/AACVPR/AAPA/ABC/ACPM/ADA/AGS/APhA/ASPC/NLA/PCNA Guideline on the Management of Blood Cholesterol: A Report of the American College of Cardiology/American Heart Association Task Force on Clinical Practice Guidelines. J Am Coll Cardiol. 2019;73(24):3168-3209.  Kardie Tobb, DO [facility]  The noncardiac portion of this study will be interpreted in separate report by the radiologist.   Electronically Signed By: Kardie  Tobb D.O. On: 05/05/2021 19:02  Narrative CLINICAL DATA:  This over-read does not include interpretation of cardiac or coronary anatomy or pathology. The coronary calcium  score interpretation by the cardiologist is attached.  COMPARISON:  None Available.  FINDINGS: Vascular: Heart is normal size. Aorta normal caliber. Scattered calcifications in the aortic root and arch.  Mediastinum/Nodes: No adenopathy  Lungs/Pleura: Areas of scarring in the lung bases. Elevation of the right hemidiaphragm. No acute confluent opacities or effusions.  Upper Abdomen: Small gallstone layering within the gallbladder. Scattered low-density lesions in the liver measuring up to 3 cm most compatible with cysts. No acute findings.  Musculoskeletal: Chest wall soft tissues are unremarkable. No acute bony abnormality.  IMPRESSION: Scattered aortic atherosclerosis.  Cholelithiasis.  Scattered hepatic cysts.  No acute extra cardiac abnormality.  Electronically Signed: By: Franky Crease M.D. On: 05/05/2021 10:58     ______________________________________________________________________________________________       Current Reported  Medications:.    Current Meds  Medication Sig   Coenzyme Q10 (CO Q-10) 100 MG CAPS Take 1 tablet by mouth every morning.   ELIQUIS  5 MG TABS tablet TAKE ONE TABLET BY MOUTH TWICE DAILY   flecainide  (TAMBOCOR ) 50 MG tablet Take 1.5 tablets (75 mg total) by mouth 2 (two) times daily.   fluticasone  (FLONASE ) 50 MCG/ACT nasal spray Place 1 spray into both nostrils daily.   losartan  (COZAAR ) 25 MG tablet Take 25 mg by mouth daily.   Multiple Vitamin (MULTI-VITAMIN DAILY) TABS Take 1 tablet by mouth every morning.   Polyethyl Glycol-Propyl Glycol (SYSTANE) 0.4-0.3 % SOLN Place 1 drop into both eyes as needed.   rosuvastatin  (CRESTOR ) 20 MG tablet Take 1 tablet (20 mg total) by mouth daily.   sertraline  (ZOLOFT ) 25 MG tablet Take 1 tablet (25 mg total) by mouth daily.   testosterone  (ANDROGEL ) 50 MG/5GM (1%) GEL Place onto the skin daily.    Physical Exam:    VS:  BP 132/78   Pulse 71   Ht 6' (1.829 m)   Wt 232 lb (105.2 kg)   SpO2 96%   BMI 31.46 kg/m    Wt Readings from Last 3 Encounters:  11/07/23 232 lb (105.2 kg)  06/20/23 238 lb 8 oz (108.2 kg)  03/19/23 242 lb (109.8 kg)    GEN: Well nourished, well developed in no acute distress NECK: No JVD; No carotid bruits CARDIAC: RRR, no murmurs, rubs, gallops RESPIRATORY:  Clear to auscultation without rales, wheezing or rhonchi  ABDOMEN: Soft, non-tender, non-distended EXTREMITIES:  No  edema; No acute deformity     Asessement and Plan:.    Presyncope/fatigue/paroxysmal SVT/afib: Monitor in /2023 showed predominantly sinus rhythm with frequent PVCs, occasional PACs, 24 short runs of NSVT (longest 7 beats), 225 runs of SVT, (longest 17.8 seconds, and runs of PAF with less than 1% burden, he was started on Eliquis .  He was referred to EP and was started on low-dose beta-blocker and flecainide  with improvement in symptoms.  He wore a repeat monitor in 10/2021 that indicated predominant PACs/PVCs, nonsustained SVT (less than 10 seconds) and  no atrial fibrillation. He notes history of experiencing presyncope and fatigue last year after starting on flecainide , this resolved later in the year in 2023.  He notes he did not have any episodes while wearing his cardiac monitor in October 2023. Repeat cardiac monitor in 09/2022 showed an average heart rate of 69 bpm, ranging from 48 to 184 bpm.  Predominant underlying rhythm was sinus rhythm, he had 7 runs of ventricular tachycardia, 16 runs of supraventricular tachycardia run with the fastest interval lasting 4 beats with a max rate of 194 bpm, isolated VE's were occasional at 3.9%. Today he reports that he is doing well, denies any presyncope, syncope or increased palpitations.  Patient notes concerns regarding ongoing use of flecainide  and would like to discuss with the EP possible discontinuation versus only starting on metoprolol , will schedule close follow-up with EP provider.  There were some noted confusions regarding his metoprolol , per patient he has not been taking recently as per his PCP reviewing Dr. Adrian note he was no longer to be taking, patient notes that he has been doing well with this.  He will follow-up with the EP to discuss ongoing use of flecainide .  He denies any bleeding problems on Eliquis .  Continue Eliquis  5 mg twice daily, flecainide  75 mg twice daily. Check CBC and BMET.   Coronary artery calcifications: Noted to have an elevated coronary calcium  score at 1374, 90th percentile, in 05/2021.  He underwent Myoview  which showed severe defect at rest worse in the basal inferior and inferoseptal wall, this was felt to not be consistent with ischemia or infarct.  Catheterization was deferred unless he develops symptoms concerning for angina. Patient was negative to ETT on 01/04/2023. Stable with no anginal symptoms. No indication for ischemic evaluation.  Heart healthy diet and regular cardiovascular exercise encouraged.  Patient not on aspirin given need for DOAC.  Continue Crestor   20 mg daily.  Hypertension: Blood pressure today 138/70, on recheck was 132/78.  Continue losartan  25 mg daily.  Hyperlipidemia: Last lipid profile on 06/28/2023 indicated total cholesterol 119, triglyceride 61, HDL 43 and LDL 63. Continue Crestor  20 mg daily.    Disposition: F/u with Dr. Court in six months or sooner if needed.   Signed, Raquel Racey D Shaylee Stanislawski, NP

## 2023-11-07 ENCOUNTER — Encounter: Payer: Self-pay | Admitting: Cardiology

## 2023-11-07 ENCOUNTER — Ambulatory Visit: Attending: Cardiology | Admitting: Cardiology

## 2023-11-07 VITALS — BP 132/78 | HR 71 | Ht 72.0 in | Wt 232.0 lb

## 2023-11-07 DIAGNOSIS — R931 Abnormal findings on diagnostic imaging of heart and coronary circulation: Secondary | ICD-10-CM | POA: Diagnosis not present

## 2023-11-07 DIAGNOSIS — I493 Ventricular premature depolarization: Secondary | ICD-10-CM | POA: Diagnosis not present

## 2023-11-07 DIAGNOSIS — E782 Mixed hyperlipidemia: Secondary | ICD-10-CM

## 2023-11-07 DIAGNOSIS — I480 Paroxysmal atrial fibrillation: Secondary | ICD-10-CM

## 2023-11-07 DIAGNOSIS — I10 Essential (primary) hypertension: Secondary | ICD-10-CM

## 2023-11-07 DIAGNOSIS — Z79899 Other long term (current) drug therapy: Secondary | ICD-10-CM

## 2023-11-07 LAB — CBC

## 2023-11-07 NOTE — Patient Instructions (Signed)
 Medication Instructions:   NO CHANGES  *If you need a refill on your cardiac medications before your next appointment, please call your pharmacy*  Lab Work:  CBC and BMET today -- 1st floor  If you have labs (blood work) drawn today and your tests are completely normal, you will receive your results only by: MyChart Message (if you have MyChart) OR A paper copy in the mail If you have any lab test that is abnormal or we need to change your treatment, we will call you to review the results.   Follow-Up: At [facility], you and your health needs are our priority.  As part of our continuing mission to provide you with exceptional heart care, our providers are all part of one team.  This team includes your primary Cardiologist (physician) and Advanced Practice Providers or APPs (Physician Assistants and Nurse Practitioners) who all work together to provide you with the care you need, when you need it.  Your next appointment:    6 months with Dr. Court   Schedule appointment with Charlies PA or Dr. Waddell -- first available   We recommend signing up for the patient portal called MyChart.  Sign up information is provided on this After Visit Summary.  MyChart is used to connect with patients for Virtual Visits (Telemedicine).  Patients are able to view lab/test results, encounter notes, upcoming appointments, etc.  Non-urgent messages can be sent to your provider as well.   To learn more about what you can do with MyChart, go to [email or web address].   Other Instructions

## 2023-11-08 ENCOUNTER — Ambulatory Visit: Payer: Self-pay | Admitting: Cardiology

## 2023-11-08 LAB — BASIC METABOLIC PANEL WITH GFR
BUN/Creatinine Ratio: 19 (ref 10–24)
BUN: 17 mg/dL (ref 8–27)
CO2: 22 mmol/L (ref 20–29)
Calcium: 9.3 mg/dL (ref 8.6–10.2)
Chloride: 102 mmol/L (ref 96–106)
Creatinine, Ser: 0.9 mg/dL (ref 0.76–1.27)
Glucose: 100 mg/dL — ABNORMAL HIGH (ref 70–99)
Potassium: 4.5 mmol/L (ref 3.5–5.2)
Sodium: 143 mmol/L (ref 134–144)
eGFR: 90 mL/min/1.73 (ref >59)

## 2023-11-08 LAB — CBC
Hematocrit: 46.4 % (ref 37.5–51.0)
Hemoglobin: 14.6 g/dL (ref 13.0–17.7)
MCH: 29.7 pg (ref 26.6–33.0)
MCHC: 31.5 g/dL (ref 31.5–35.7)
MCV: 95 fL (ref 79–97)
Platelets: 274 x10E3/uL (ref 150–450)
RBC: 4.91 x10E6/uL (ref 4.14–5.80)
RDW: 15 % (ref 11.6–15.4)
WBC: 8.5 x10E3/uL (ref 3.4–10.8)

## 2023-11-12 NOTE — Progress Notes (Unsigned)
 Cardiology Office Note:  .   Date:  11/12/2023  ID:  Donald Claudene Raddle., DOB Feb 01, 1950, MRN [id number] PCP: Seabron Lenis, MD   [facility] Providers Cardiologist:  Dorn Lesches, MD Electrophysiologist:  Danelle Birmingham, MD { EP: Dr. Birmingham  History of Present Illness: .   Donald Washko. is a 73 y.o. 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   -- coronary calcium  scoring which was elevated at 1374, which was 90th percentile for age and sex matched controls. He underwent a Myoview  which showed a severe defect at rest worse in the basal inferior inferoseptal wall. This was not felt to be consistent with ischemia or infarct. Cardiac cath was considered but this was deferred   He saw Dr. Birmingham 10/06/21, discussed his nsSVT/NSVT, w/u with Ca score of 1300+ Placed on flecainide  with good resolution of palpitations.  C/o fatigue, wondered if it was his BB   He held his BB for a short time/few days without improvement and resumed it  -- Repeat monitor in 10/2021 showed predominant sinus rhythm with rare PACs/PVCs, nonsustained SVT lasting less than 10 seconds, and no atrial fibrillation.   Seeing cardiology team a few times since then, of late with frequent episodes of near syncope, feeling tired, some palpitations. Pt mentioned similar symptoms last summer, and wwas trialed a couple days off flecainide  without improvement apparently and continued. Symptoms did get better but started back up again recently.   most recently saw cardiology APP 09/11/22, episodes can last as long as  15 minutes , orthostatics were negative, planned for monitoring, labs, may need cath/new ischemic evaluation, planned to have him see EP post monitor 1st  SR/SB/ST Freq PVCs (4% burden)  Short runs of SVT/NSVT Needs ROV with me or an APP to discuss  I saw him 10/13/22 He reported: Generally never really feeling well, tired all of the time, no energy Often if not most days about 1100 for a few hours a clear slump in his energy with sense of feeling lightheaded as well, lasts a few hours and then starts to get back to his baseline Near syncope, these are more random, and a sense of a few seconds or so that he is about to faint and then quickly gone, not particularly position or upon stading, but thinks only happens when up on his feet  --He notes that when his BP gets towards the 110's he feels worese and gets there fairly regularly and even low 100s as well.  Never findds it high --He reports that all his life he has been pretty sensitive to medications and thinks symptoms may be part of that -- he has not felt any Afib  Symptom episodes on his monitor associate with PVCs, trigemeny mostly, though some symptom episodes with SR 90's, rare ectopy He is having day in and day out symptoms Ectopy burden was  1% supraventricular,  and 3.8% ventricular Avg HR 69, min was 48 at 04:46am In my personal review No QRS widening or AV block No CP No SOB No  bleeding His losartan  stopped given symptoms seemed to line up timing of drug  I saw him 11/14/22, doing a bit better, still feeling a slump in energy late morning, urged to exercise/increase physical activity as able. Stable intervals Discussed coronary eval (discussed above), no changes were made  Saw cards APP 12/12/22, again doing a bit better, back on losartan  with elevated BPs Also discussed AAD with Dr. Waddell There is not. He would have to come into the hospital for 3.5 days and start dofetilide, off label. If no evidence of ischemia on  exercise test, or QRS widening over 25%, continue flecainide .   I saw him march 2025 For the most part about the same Just never really feels like he feels well Has taken notice that he feels like he has head congestion, sinuses, wonders if that may be the problem He again, experimented off flecainide  about 4 days with  no improvement in how he felt and resume it again without improvement or worsening in symptoms Over the weekend felt dizzy with a sense of feeling off balance No CP, SOB, DOE No syncope Stable intervals No changes made Advised adequate hydration/oral intake  Saw Dr. Court 03/19/23, dizziness felt 2/2 sinus infection via PMD >> doing better No changes  He saw dr. Waddell discussed improved palpitations since on flecainide  > though c/o fatigue prompted stopping his BB with no improvement > resumed.  Encouraged physical actiity to try and improve his fatigue/stamina  He saw K. Devora 11/07/23, he expressed concerns of possible side effects of flecainide , while not having side effects, did have concerns. Some confusion on of he was/wasn't supposed to be taking the metoprolol  (was no longer taking it at the rec of his PMD, by his understanding pf Dr. Adrian note). Pt wanted to consider metoprolol  alone without flecainide    Today's visit is scheduled to discuss recent medication changes ROS:   He reports since on flecainide  his palpitations, ectopy/PVCs are better Though worries that he may be having side effect. Reports a sense of lightheaded, dizzy that is intermittent, no clear pattern/trigger, other then only feels it when he is standing up. Generally are brief, does not get the sense that things are greying or blacking out Not upon standing, not seated, not when supine. He says is reminiscent of how he felt when he was a child before he would faint  He has been off metoprolol  since about June/July Thinks the flecainide  might be causing the symptoms > would like to try  off it > perhaps use metoprolol  alone  He has not had  Otherwise doing fine, no changes since his visit with Katlyn.   Arrhythmia/AAD hx AFib, NS-SVT, NSVTs Flecainide  started June 2023  Studies Reviewed: SABRA    EKG done today and reviewed by myself SR 81bpm, PR , QRS 96ms, QTc  03/14/23: SR 68bpm, LAD, PR , QRS 98ms, QTc No ectopy  11/14/22: SR 67bpm, , LAD, PR , QRS , QTc 10/13/22: SR 63bpm, PR , QRS 96ms, QTc  Risk Assessment/Calculations:    Physical Exam:   VS:  There were no vitals taken for this visit.   Wt Readings from Last 3 Encounters:  11/07/23 232 lb (105.2 kg)  06/20/23 238 lb 8 oz (108.2 kg)  03/19/23 242 lb (109.8 kg)    GEN: Well nourished, well developed in no acute distress NECK: No JVD; No carotid bruits CARDIAC: RRR, no ectopy appreciated today with prolonged auscultation, no murmurs, rubs, gallops RESPIRATORY:  CTA b/l without rales, wheezing or rhonchi  ABDOMEN: Soft, non-tender, non-distended EXTREMITIES: No edema; No deformity     ASSESSMENT AND PLAN: .    NS-SVTs NSVT PVCs Paroxysmal AFib CHA2DS2Vasc is *** 3, on Eliquis , appropriately dosed Minimal/rare fleeting palpitations  Coronary Ca++ stress test not felt to represent any ischemia in review of Dr. Ranee notes, diaphragmatic attenuation without ischemia  Will make no changes at this time to his AAD   On prior monitor, symptom traciongs associated with PVCs as well as SR without significant ectopy TODAY mentions he has felt off/a bit lightheaded > attributes that perhaps to unusually elevated HR (80's) after a seconds cup of coffee unusual for him Though thinks he felt more comfortable with his HR 60's or so on the metoprolol   Stop flecainide  Ill have him back in 2-3 weeks Could consider dilt I dont think Tikosyn would be great given also has PVCs, NSVT Amio might be an option > thpough would like to avoid it  5.  Fatigue 6.   Weakness 7.  Lightheaded Encouraged better water intake/hydration Not clearly or appreciably improved off BB In the past treated for sinus infection has been helpful Encouraged d/w his PMD  8.  Secondary hypercoagulable state   9. HTN No changes today   Dispo: ***, sooner if needed   Signed, Charlies Macario Arthur, PA-C

## 2023-11-15 ENCOUNTER — Ambulatory Visit: Attending: Physician Assistant | Admitting: Physician Assistant

## 2023-11-15 VITALS — BP 132/84 | HR 81 | Ht 72.0 in | Wt 231.0 lb

## 2023-11-15 DIAGNOSIS — D6869 Other thrombophilia: Secondary | ICD-10-CM

## 2023-11-15 DIAGNOSIS — I4729 Other ventricular tachycardia: Secondary | ICD-10-CM | POA: Diagnosis not present

## 2023-11-15 DIAGNOSIS — I480 Paroxysmal atrial fibrillation: Secondary | ICD-10-CM

## 2023-11-15 DIAGNOSIS — I493 Ventricular premature depolarization: Secondary | ICD-10-CM | POA: Diagnosis not present

## 2023-11-15 DIAGNOSIS — R42 Dizziness and giddiness: Secondary | ICD-10-CM

## 2023-11-15 DIAGNOSIS — I471 Supraventricular tachycardia, unspecified: Secondary | ICD-10-CM

## 2023-11-15 DIAGNOSIS — Z5181 Encounter for therapeutic drug level monitoring: Secondary | ICD-10-CM | POA: Diagnosis not present

## 2023-11-15 DIAGNOSIS — Z79899 Other long term (current) drug therapy: Secondary | ICD-10-CM

## 2023-11-15 NOTE — Patient Instructions (Signed)
 Medication Instructions:   STOP  TAKING : FLECAINIDE    *If you need a refill on your cardiac medications before your next appointment, please call your pharmacy*   Lab Work: NONE ORDERED  TODAY   If you have labs (blood work) drawn today and your tests are completely normal, you will receive your results only by: MyChart Message (if you have MyChart) OR A paper copy in the mail If you have any lab test that is abnormal or we need to change your treatment, we will call you to review the results.  Testing/Procedures: NONE ORDERED  TODAY    Follow-Up: At [facility], you and your health needs are our priority.  As part of our continuing mission to provide you with exceptional heart care, our providers are all part of one team.  This team includes your primary Cardiologist (physician) and Advanced Practice Providers or APPs (Physician Assistants and Nurse Practitioners) who all work together to provide you with the care you need, when you need it.  Your next appointment:   2 -3  WEEKS   Provider:   Charlies Arthur, PA-C  ( CONTACT  CASSIE HALL/ ANGELINE HAMMER FOR EP SCHEDULING ISSUES )   We recommend signing up for the patient portal called MyChart.  Sign up information is provided on this After Visit Summary.  MyChart is used to connect with patients for Virtual Visits (Telemedicine).  Patients are able to view lab/test results, encounter notes, upcoming appointments, etc.  Non-urgent messages can be sent to your provider as well.   To learn more about what you can do with MyChart, go to [email or web address].   Other Instructions

## 2023-12-24 NOTE — Progress Notes (Signed)
 " Cardiology Office Note:  .   Date:  12/24/2023  ID:  Donald Norman., DOB 04/07/1950, MRN [id number] PCP: Seabron Lenis, MD  [facility] Providers Cardiologist:  Dorn Lesches, MD Electrophysiologist:  Danelle Birmingham, MD { EP: Dr. Birmingham  History of Present Illness: .   Donald Norman. is a 73 y.o. 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   -- coronary calcium  scoring which was elevated at 1374, which was 90th percentile for age and sex matched controls. He underwent a Myoview  which showed a severe defect at rest worse in the basal inferior inferoseptal wall. This was not felt to be consistent with ischemia or infarct. Cardiac cath was considered but this was deferred   He saw Dr. Birmingham 10/06/21, discussed his nsSVT/NSVT, w/u with Ca score of 1300+ Placed on flecainide  with good resolution of palpitations.  C/o fatigue, wondered if it was his BB   He held his BB for a short time/few days without improvement and resumed it  -- Repeat monitor in 10/2021 showed predominant sinus rhythm with rare PACs/PVCs, nonsustained SVT lasting less than 10 seconds, and no atrial fibrillation.   Seeing cardiology team a few times since then, of late with frequent episodes of near syncope, feeling tired, some palpitations. Pt mentioned similar symptoms last summer, and wwas trialed a couple days off flecainide  without improvement apparently and continued. Symptoms did get better but started back up again recently.   most recently saw cardiology APP 09/11/22, episodes can last as long as  15 minutes , orthostatics were negative, planned for monitoring, labs, may need cath/new ischemic evaluation, planned to have him see EP post monitor 1st  SR/SB/ST Freq PVCs (4% burden)  Short runs of SVT/NSVT Needs ROV with me or an APP to discuss  I saw him 10/13/22 He reported: Generally never really feeling well, tired all of the time, no energy Often if not most days about 1100 for a few hours a clear slump in his energy with sense of feeling lightheaded as well, lasts a few hours and then starts to get back to his baseline Near syncope, these are more random, and a sense of a few seconds or so that he is about to faint and then quickly gone, not particularly position or upon stading, but thinks only happens when up on his feet  --He notes that when his BP gets towards the 110's he feels worese and gets there fairly regularly and even low 100s as well.  Never findds it high --He reports that all his life he has been pretty sensitive to medications and thinks symptoms may be part of that -- he has not felt any Afib  Symptom episodes on his monitor associate with PVCs, trigemeny mostly, though some symptom episodes with SR 90's, rare ectopy He is having day in and day out symptoms Ectopy burden was  1% supraventricular,  and 3.8% ventricular Avg HR 69, min was 48 at 04:46am In my personal review No QRS widening or AV block No CP No SOB  No bleeding His losartan  stopped given symptoms seemed to line up timing of drug  I saw him 11/14/22, doing a bit better, still feeling a slump in energy late morning, urged to exercise/increase physical activity as able. Stable intervals Discussed coronary eval (discussed above), no changes were made  Saw cards APP 12/12/22, again doing a bit better, back on losartan  with elevated BPs Also discussed AAD with Dr. Waddell There is not. He would have to come into the hospital for 3.5 days and start dofetilide, off label. If no evidence of ischemia on  exercise test, or QRS widening over 25%, continue flecainide .   I saw him march 2025 For the most part about the same Just never really feels like he feels well Has taken notice that he feels like he has head congestion, sinuses, wonders if that may be the problem He again, experimented off flecainide  about 4 days with  no improvement in how he felt and resume it again without improvement or worsening in symptoms Over the weekend felt dizzy with a sense of feeling off balance No CP, SOB, DOE No syncope Stable intervals No changes made Advised adequate hydration/oral intake  Saw Dr. Court 03/19/23, dizziness felt 2/2 sinus infection via PMD >> doing better No changes  He saw dr. Waddell discussed improved palpitations since on flecainide  > though c/o fatigue prompted stopping his BB with no improvement > resumed.  Encouraged physical actiity to try and improve his fatigue/stamina  He saw K. Devora 11/07/23, he expressed concerns of possible side effects of flecainide , while not having side effects, did have concerns. Some confusion on of he was/wasn't supposed to be taking the metoprolol  (was no longer taking it at the rec of his PMD, by his understanding pf Dr. Adrian note). Pt wanted to consider metoprolol  alone without flecainide   I saw him 11/15/23 He reports since on flecainide  his palpitations, ectopy/PVCs are better Though worries that he may be having side effect. Reports a sense of lightheaded, dizzy that is intermittent, no clear pattern/trigger, other then only feels it when he is standing up. Generally are brief, does not get the sense that things are greying or blacking out Not upon standing, not seated, not when supine. He says is reminiscent of how he felt when he was a child before he would faint He has been off metoprolol  since about June/July Thinks the flecainide  might be causing the symptoms > would like to try off it > perhaps use metoprolol  alone Otherwise doing  fine, no changes since his visit with Katlyn. Flecainide  stopped w/consideration for dilt, not a tikosyn candidate, perhaps amio Advised to see his PMD for revisit of sinuses   Today's visit is scheduled as a 2-3 week f/u ROS:   Off flecainide  >> no improvement in his intermittent dizziness, symptoms he thought were the medications Did though eventually have a significant episode of clear vertigo > was seen, found with sinus infection and fluid behind both ears Rx abx, zyrtec, meclizine. His symptoms are better not completely resolved  He did note when off the flecainide  his HR seem erratic Since officially found with alternative etiology for his symptoms > resumed his flecainide  and HR stabilized with no worsening of his symptoms otherwise.  No CP, no near syncope or syncope No SOB  He has had a couple nose bleeds  Arrhythmia/AAD hx AFib, NS-SVT, NSVTs Flecainide  started June 2023  Studies Reviewed: SABRA    EKG done today and reviewed y myself SR 78bpm, LAD, PR  , QRS , QTc , no ectopy  Risk Assessment/Calculations:    Physical Exam:   VS:  There were no vitals taken for this visit.   Wt Readings from Last 3 Encounters:  11/15/23 231 lb (104.8 kg)  11/07/23 232 lb (105.2 kg)  06/20/23 238 lb 8 oz (108.2 kg)    GEN: Well nourished, well developed in no acute distress NECK: No JVD; No carotid bruits CARDIAC: RRR, no ectopy appreciated today with prolonged auscultation, no murmurs, rubs, gallops RESPIRATORY:  CTA b/l without rales, wheezing or rhonchi  ABDOMEN: Soft, non-tender, non-distended EXTREMITIES: No edema; No deformity     ASSESSMENT AND PLAN: .    NS-SVTs NSVT PVCs Paroxysmal AFib CHA2DS2Vasc is 3, on Eliquis , appropriately dosed Minimal/rare fleeting palpitations  Back on flecainide  with stable intervals Given hx of AFib > start Toprol  25mg  daily  Coronary Ca++ stress test not felt to represent any ischemia in review of Dr. Ranee  notes, diaphragmatic attenuation without ischemia  Will make no changes at this time to his AAD   On prior monitor, symptom traciongs associated with PVCs as well as SR without significant ectopy  Advised he f/u with his PMD/team for ongoing management of his vertigo Might benefit from PT  8.  Secondary hypercoagulable state   9. HTN No changes today   Dispo: back in 2-3 weeks on both flecainide  and metoprolol  again, sooner if needed   Signed, Charlies Macario Arthur, PA-C   "

## 2023-12-26 ENCOUNTER — Ambulatory Visit: Attending: Cardiology | Admitting: Physician Assistant

## 2023-12-26 ENCOUNTER — Encounter: Payer: Self-pay | Admitting: Physician Assistant

## 2023-12-26 VITALS — BP 146/82 | HR 87 | Ht 72.0 in | Wt 227.0 lb

## 2023-12-26 DIAGNOSIS — Z79899 Other long term (current) drug therapy: Secondary | ICD-10-CM

## 2023-12-26 DIAGNOSIS — I480 Paroxysmal atrial fibrillation: Secondary | ICD-10-CM | POA: Diagnosis not present

## 2023-12-26 DIAGNOSIS — Z5181 Encounter for therapeutic drug level monitoring: Secondary | ICD-10-CM | POA: Diagnosis not present

## 2023-12-26 DIAGNOSIS — I4729 Other ventricular tachycardia: Secondary | ICD-10-CM

## 2023-12-26 DIAGNOSIS — I493 Ventricular premature depolarization: Secondary | ICD-10-CM | POA: Diagnosis not present

## 2023-12-26 MED ORDER — FLECAINIDE ACETATE 50 MG PO TABS: 75.0000 mg | ORAL_TABLET | Freq: Two times a day (BID) | ORAL | 2 refills | Status: AC

## 2023-12-26 MED ORDER — METOPROLOL SUCCINATE ER 25 MG PO TB24: 25.0000 mg | ORAL_TABLET | Freq: Every day | ORAL | 2 refills | Status: AC

## 2023-12-26 NOTE — Patient Instructions (Signed)
 Medication Instructions:    START TAKING : TOPROL   XL 25 MG ONCE A DAY    *If you need a refill on your cardiac medications before your next appointment, please call your pharmacy*    Lab Work: NONE ORDERED  TODAY    If you have labs (blood work) drawn today and your tests are completely normal, you will receive your results only by: MyChart Message (if you have MyChart) OR A paper copy in the mail If you have any lab test that is abnormal or we need to change your treatment, we will call you to review the results.  Testing/Procedures: NONE ORDERED  TODAY    Follow-Up: At [facility], you and your health needs are our priority.  As part of our continuing mission to provide you with exceptional heart care, our providers are all part of one team.  This team includes your primary Cardiologist (physician) and Advanced Practice Providers or APPs (Physician Assistants and Nurse Practitioners) who all work together to provide you with the care you need, when you need it.  Your next appointment:    2 -3 week(s)  Provider:   Charlies Arthur, [email or web address]   We recommend signing up for the patient portal called MyChart.  Sign up information is provided on this After Visit Summary.  MyChart is used to connect with patients for Virtual Visits (Telemedicine).  Patients are able to view lab/test results, encounter notes, upcoming appointments, etc.  Non-urgent messages can be sent to your provider as well.   To learn more about what you can do with MyChart, go to [email or web address].   Other Instructions

## 2024-01-09 ENCOUNTER — Ambulatory Visit: Attending: Family Medicine | Admitting: Physical Therapy

## 2024-01-09 DIAGNOSIS — H8112 Benign paroxysmal vertigo, left ear: Secondary | ICD-10-CM | POA: Diagnosis present

## 2024-01-09 NOTE — Therapy (Signed)
 " OUTPATIENT PHYSICAL THERAPY VESTIBULAR EVALUATION     Patient Name: Donald Norman. MRN: [id number] DOB:[birth year], 74 y.o., male Today's Date: 01/10/2024  END OF SESSION:  PT End of Session - 01/10/24 1919     Visit Number 1    Number of Visits 4    Date for Recertification  02/08/24    Authorization Type [facility] Medicare    Authorization Time Period 01-09-24 - 03-08-24    PT Start Time 1105    PT Stop Time 1145    PT Time Calculation (min) 40 min    Activity Tolerance Patient tolerated treatment well    Behavior During Therapy WFL for tasks assessed/performed          Past Medical History:  Diagnosis Date   Allergy    Anemia    Basal cell cancer    Cancer (HCC)    Depression    Heart murmur    Hypertension    PVC (premature ventricular contraction)    Tendonitis    Past Surgical History:  Procedure Laterality Date   CYSTOSCOPY     EYE SURGERY  lasik and retina repair   HERNIA REPAIR     TONSILLECTOMY AND ADENOIDECTOMY     Patient Active Problem List   Diagnosis Date Noted   Hyperlipidemia 03/14/2022   PAF (paroxysmal atrial fibrillation) (HCC) 09/07/2021   Elevated coronary artery calcium  score 09/07/2021   Thoracic aortic aneurysm 09/07/2021   Atrial tachycardia 06/23/2021   Palpitations 04/19/2021   Obstructive sleep apnea 04/19/2021   Interstitial cystitis 06/09/2014   Prostatitis 06/09/2014   ED (erectile dysfunction) 06/09/2014   Hypogonadism in male 01/09/2014   Depression    Heart murmur    Hypertension    PVC (premature ventricular contraction)    Mood change 11/03/2011   Hearing decreased 11/03/2011   Elevated blood pressure 11/03/2011   Elevated PSA 11/02/2001   Microscopic hematuria 12/03/1998    PCP: Seabron Lenis, MD REFERRING PROVIDER: Wonda Worth SQUIBB, PA  REFERRING DIAG: R42 (ICD-10-CM) - Dizziness and giddiness  THERAPY DIAG:  BPPV (benign paroxysmal positional vertigo), left  ONSET DATE: 12-21-23  Rationale for Evaluation and  Treatment: Rehabilitation  SUBJECTIVE:   SUBJECTIVE STATEMENT: Pt reports he had sinus congestion  - sat up to get out of bed on 12-21-23 and then fell backwards; took Meclizine for about 5 days (prescribed by Urgent Care) but is not taking it now.  Reports stability has not been good;  Pt accompanied by: self  PERTINENT HISTORY: PAF, HTN  PAIN:  Are you having pain? No  PRECAUTIONS: None  RED FLAGS: None   WEIGHT BEARING RESTRICTIONS: No  FALLS: Has patient fallen in last 6 months? No  LIVING ENVIRONMENT: Lives with: lives with their spouse Lives in: House/apartment  PLOF: Independent  PATIENT GOALS: resolve the vertigo  OBJECTIVE:  Note: Objective measures were completed at Evaluation unless otherwise noted.   GAIT: Gait pattern: WFL   VESTIBULAR ASSESSMENT:  GENERAL OBSERVATION: pt amb. Independently without use of device   SYMPTOM BEHAVIOR:  Subjective history: pt reports onset of vertigo on 12-21-23 - sat up to get OOB and fell backwards; went to UC and was prescribed Meclizine   Non-Vestibular symptoms: N/A  Type of dizziness: Spinning/Vertigo  Frequency: varies - usually daily  Duration: secs to mins.   Aggravating factors: Induced by position change: rolling to the left  Relieving factors: head stationary  Progression of symptoms: better    POSITIONAL TESTING: Right Dix-Hallpike: no nystagmus  Left Dix-Hallpike: upbeating, left nystagmus  MOTION SENSITIVITY:  Motion Sensitivity Quotient Intensity: 0 = none, 1 = Lightheaded, 2 = Mild, 3 = Moderate, 4 = Severe, 5 = Vomiting  Intensity  1. Sitting to supine   2. Supine to L side   3. Supine to R side   4. Supine to sitting   5. L Hallpike-Dix   6. Up from L    7. R Hallpike-Dix   8. Up from R    9. Sitting, head tipped to L knee   10. Head up from L knee   11. Sitting, head tipped to R knee   12. Head up from R knee   13. Sitting head turns x5   14.Sitting head nods x5   15. In stance,  180 turn to L    16. In stance, 180 turn to R                                                                                                                                 TREATMENT DATE: 01-09-24   Canalith Repositioning:  Epley Left: Number of Reps: 3, Response to Treatment: symptoms improved, and Comment: no nystagmus and no c/o vertigo on 3rd rep  PATIENT EDUCATION: Education details: etiology of BPPV with article from [facility] given to pt. Person educated: Patient Education method: Explanation & handout Education comprehension: verbalized understanding  HOME EXERCISE PROGRAM:  GOALS: Goals reviewed with patient? Yes  SHORT TERM GOALS: same as LTG's  LONG TERM GOALS: Target date: 02-08-24  Pt will have (-) Lt Dix-Hallpike test to indicate resolution of Lt BPPV. Baseline:  Goal status: INITIAL  2.  Pt will report no dizziness with bed mobility or with any movement. Baseline:  Goal status: INITIAL  3.  Pt will verbalize understanding of exercise/Epley maneuver for self treatment prn pending reoccurrence of BPPV. Baseline:  Goal status: INITIAL   ASSESSMENT:  CLINICAL IMPRESSION: Patient is a 74 y.o. gentleman who was seen today for physical therapy evaluation and treatment for Lt BPPV posterior canalithiasis.  Pt had (+) Lt Dix-Hallpike test with Lt rotary upbeating nystagmus, indicative of Lt BPPV posterior canalithiasis.  Pt was treated with 3 reps of Epley maneuver and symptoms improved on each subsequent rep.  Pt will benefit from PT to continue to assess and treat Lt BPPV.   OBJECTIVE IMPAIRMENTS: decreased balance and dizziness.   ACTIVITY LIMITATIONS: locomotion level  PARTICIPATION LIMITATIONS: community activity and yard work  PERSONAL FACTORS: N/A are also affecting patient's functional outcome.   REHAB POTENTIAL: Excellent  CLINICAL DECISION MAKING: Stable/uncomplicated  EVALUATION COMPLEXITY: Low   PLAN:  PT FREQUENCY: 1x/week  PT DURATION: 4  weeks  PLANNED INTERVENTIONS: 97110-Therapeutic exercises, 97530- Therapeutic activity, [id number]- Neuromuscular re-education, [phone]- Self Care, [id number]- Gait training, [phone]- Canalith repositioning, and Patient/Family education  PLAN FOR NEXT SESSION: recheck Lt BPPV   Chen Holzman, [place], PT 01/10/2024, 7:24 PM  "

## 2024-01-10 ENCOUNTER — Encounter: Payer: Self-pay | Admitting: Physical Therapy

## 2024-01-22 ENCOUNTER — Ambulatory Visit: Admitting: Physical Therapy

## 2024-01-22 DIAGNOSIS — H8112 Benign paroxysmal vertigo, left ear: Secondary | ICD-10-CM

## 2024-01-22 NOTE — Therapy (Unsigned)
 " OUTPATIENT PHYSICAL THERAPY VESTIBULAR TREATMENT NOTE/DISCHARGE SUMMARY      Patient Name: Donald Norman. MRN: [id number] DOB:1950/09/07, 74 y.o., male Today's Date: 01/23/2024  END OF SESSION:  PT End of Session - 01/23/24 1910     Visit Number 2    Number of Visits 4    Date for Recertification  02/08/24    Authorization Type [facility] Medicare    Authorization Time Period 01-09-24 - 03-08-24    PT Start Time 1103    PT Stop Time 1145    PT Time Calculation (min) 42 min    Activity Tolerance Patient tolerated treatment well    Behavior During Therapy WFL for tasks assessed/performed           Past Medical History:  Diagnosis Date   Allergy    Anemia    Basal cell cancer    Cancer (HCC)    Depression    Heart murmur    Hypertension    PVC (premature ventricular contraction)    Tendonitis    Past Surgical History:  Procedure Laterality Date   CYSTOSCOPY     EYE SURGERY  lasik and retina repair   HERNIA REPAIR     TONSILLECTOMY AND ADENOIDECTOMY     Patient Active Problem List   Diagnosis Date Noted   Hyperlipidemia 03/14/2022   PAF (paroxysmal atrial fibrillation) (HCC) 09/07/2021   Elevated coronary artery calcium  score 09/07/2021   Thoracic aortic aneurysm 09/07/2021   Atrial tachycardia 06/23/2021   Palpitations 04/19/2021   Obstructive sleep apnea 04/19/2021   Interstitial cystitis 06/09/2014   Prostatitis 06/09/2014   ED (erectile dysfunction) 06/09/2014   Hypogonadism in male 01/09/2014   Depression    Heart murmur    Hypertension    PVC (premature ventricular contraction)    Mood change 11/03/2011   Hearing decreased 11/03/2011   Elevated blood pressure 11/03/2011   Elevated PSA 11/02/2001   Microscopic hematuria 12/03/1998    PCP: Seabron Lenis, MD REFERRING PROVIDER: Wonda Worth SQUIBB, PA  REFERRING DIAG: R42 (ICD-10-CM) - Dizziness and giddiness  THERAPY DIAG:  BPPV (benign paroxysmal positional vertigo), left  ONSET DATE:  12-21-23  Rationale for Evaluation and Treatment: Rehabilitation  SUBJECTIVE:   SUBJECTIVE STATEMENT: Pt reports he no longer has any of the spinning vertigo but reports he has some mild dizziness - doesn't know what is causing it - wonders if it could be due to side effect of some of his medications   Pt accompanied by: self  PERTINENT HISTORY: PAF, HTN  PAIN:  Are you having pain? Pt reports achy feeling in back of neck at base of skull, some limitation in ROM  PRECAUTIONS: None  RED FLAGS: None   WEIGHT BEARING RESTRICTIONS: No  FALLS: Has patient fallen in last 6 months? No  LIVING ENVIRONMENT: Lives with: lives with their spouse Lives in: House/apartment  PLOF: Independent  PATIENT GOALS: resolve the vertigo  OBJECTIVE:  Note: Objective measures were completed at Evaluation unless otherwise noted.   GAIT: Gait pattern: WFL   VESTIBULAR ASSESSMENT:  GENERAL OBSERVATION: pt amb. Independently without use of device   SYMPTOM BEHAVIOR:  Subjective history: pt reports onset of vertigo on 12-21-23 - sat up to get OOB and fell backwards; went to UC and was prescribed Meclizine   Non-Vestibular symptoms: N/A  Type of dizziness: Spinning/Vertigo  Frequency: varies - usually daily  Duration: secs to mins.   Aggravating factors: Induced by position change: rolling to the left  Relieving factors:  head stationary  Progression of symptoms: better    POSITIONAL TESTING: Right Dix-Hallpike: no nystagmus Left Dix-Hallpike: upbeating, left nystagmus  MOTION SENSITIVITY:  Motion Sensitivity Quotient Intensity: 0 = none, 1 = Lightheaded, 2 = Mild, 3 = Moderate, 4 = Severe, 5 = Vomiting  Intensity  1. Sitting to supine   2. Supine to L side   3. Supine to R side   4. Supine to sitting   5. L Hallpike-Dix   6. Up from L    7. R Hallpike-Dix   8. Up from R    9. Sitting, head tipped to L knee   10. Head up from L knee   11. Sitting, head tipped to R knee   12.  Head up from R knee   13. Sitting head turns x5   14.Sitting head nods x5   15. In stance, 180 turn to L    16. In stance, 180 turn to R                                                                                                                                 TREATMENT DATE: 01-22-24  TherAct: Lt Dix-Hallpike test (-) with no nystagmus and no c/o vertigo in test position  Lt and Rt sidelying tests (-) with no nystagmus and no c/o vertigo in either test position Pt able to extend neck and look up at ceiling with no c/o dizziness  TherEx:  Pt lied supine with use of Chirp wheel for pressure application due to c/o cervical tightness - gave pt info for ordering this item online  HEP - for cervical tightness  Access Code: [email or web address] URL: [email or web address] Date: 01/23/2024 Prepared by: Rock Kussmaul  Exercises - Seated Assisted Cervical Rotation with Towel  - 1 x daily - 7 x weekly - 2 sets - 2 reps - 20-30 hold - Seated Cervical Sidebending Stretch  - 1 x daily - 7 x weekly - 2 sets - 2 reps - 20 sec hold - Cervical Extension AROM with Strap  - 1 x daily - 7 x weekly - 1 sets - 2 reps - 20 sec hold - Cervical Retraction at Wall  - 1 x daily - 7 x weekly - 1 sets - 10 reps - 3 sec hold - Supine Chin Tuck  - 1 x daily - 7 x weekly - 1 sets - 10 reps - 2-3 hold   PATIENT EDUCATION: Education details: etiology of BPPV with article from [facility] given to pt. Person educated: Patient Education method: Explanation & handout Education comprehension: verbalized understanding  HOME EXERCISE PROGRAM:  GOALS: Goals reviewed with patient? Yes  SHORT TERM GOALS: same as LTG's  LONG TERM GOALS: Target date: 02-08-24  Pt will have (-) Lt Dix-Hallpike test to indicate resolution of Lt BPPV. Baseline:  Goal status: Goal met 01-22-24  2.  Pt will report no dizziness with bed mobility or with any  movement. Baseline:  Goal status: Goal met 01-22-24  3.  Pt will verbalize  understanding of exercise/Epley maneuver for self treatment prn pending reoccurrence of BPPV. Baseline:  Goal status: MET 01-22-24   ASSESSMENT:  CLINICAL IMPRESSION: All positional testing is negative, indicating resolution of Lt BPPV.  Pt reported no spinning vertigo with any movement or bed mobility.  Pt has met 3/3 LTG's.  Pt was instructed in HEP for cervical musc. stretching and strengthening due to his c/o's of discomfort in posterior cervical region.  Pt reported less tightness in cervical musc. At end of session, stating his neck felt better than it did at start of session.  Pt is discharged due to goals met.  OBJECTIVE IMPAIRMENTS: decreased balance and dizziness.   ACTIVITY LIMITATIONS: locomotion level  PARTICIPATION LIMITATIONS: community activity and yard work  PERSONAL FACTORS: N/A are also affecting patient's functional outcome.   REHAB POTENTIAL: Excellent  CLINICAL DECISION MAKING: Stable/uncomplicated  EVALUATION COMPLEXITY: Low   PLAN:  PT FREQUENCY: 1x/week  PT DURATION: 4 weeks  PLANNED INTERVENTIONS: 97110-Therapeutic exercises, 97530- Therapeutic activity, [id number]- Neuromuscular re-education, [phone]- Self Care, 02883- Gait training, [phone]- Canalith repositioning, and Patient/Family education  PLAN FOR NEXT SESSION:  D/C on 01-22-24   PHYSICAL THERAPY DISCHARGE SUMMARY  Visits from Start of Care: 2  Current functional level related to goals / functional outcomes: See above for progress towards goals - Lt BPPV has resolved   Remaining deficits: Pt reports continued mild non-specific dizziness - unknown etiology Reported mild discomfort/tightness in cervical musc. In today's session - HEP was issued for cervical musc. stretching   Education / Equipment: Pt has been instructed in Epley maneuver for self-treatment of BPPV should he have a recurrent episode in the future. Also added HEP for cervical stretching/ROM    Patient agrees to discharge.  Patient goals were met. Patient is being discharged due to meeting the stated rehab goals.    Roxanna [place], PT 01/23/2024, 7:14 PM  "

## 2024-01-23 ENCOUNTER — Encounter: Payer: Self-pay | Admitting: Physical Therapy

## 2024-02-26 ENCOUNTER — Ambulatory Visit: Admitting: Physician Assistant
